# Patient Record
Sex: Female | Born: 1980 | Race: White | Hispanic: No | Marital: Single | Smoking: Former smoker
Health system: Southern US, Community
[De-identification: ages and names within clinical notes are randomized; demographics above are authoritative.]

## PROBLEM LIST (undated history)

## (undated) DIAGNOSIS — F319 Bipolar disorder, unspecified: Secondary | ICD-10-CM

## (undated) DIAGNOSIS — F4310 Post-traumatic stress disorder, unspecified: Secondary | ICD-10-CM

## (undated) HISTORY — DX: Post-traumatic stress disorder, unspecified: F43.10

## (undated) HISTORY — PX: HAND SURGERY: SHX662

## (undated) HISTORY — DX: Bipolar disorder, unspecified: F31.9

---
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
## 2014-02-20 NOTE — L&D Delivery Note (Cosign Needed)
Delivery Note Kristen DouglasMaria A Howard is a 34 y.o. female G3P1011 with an uncomplicated IUP at 9788w2d who presented on 12/30/2014 for SOL. She was assessed to be in active labor on arrival and was subsequently admitted to the Labor & Delivery unit. An epidural was placed at the patient's request. She progressed without issue or any augmentation. At 11:39 PM she delivered a healthy baby boy via uncomplicated spontaneous vaginal delivery (LOA). Cord clamping was delayed and third stage of labor proceeded as expected with spontaneous delivery of the placenta and subsequent hemostasis.  APGAR (1 MIN): 8   APGAR (5 MINS): 9   Weight: 7 lb 15 oz (3600 g) Placenta status: intact Cord: 3 vessel Complications: none  Anesthesia: Epidural  Episiotomy: None Lacerations: 1st degree L labial; 1st degree perineal Suture Repair: 3.0 vicryl Est. Blood Loss (mL): 350 mL   Mom to postpartum.  Baby to Couplet care / Skin to Skin.  Gerri Sporeaitlin Sullivan, MD 12/31/2014 12:39 AM   Patient is a G3P1011 at 5052w1d who was admitted w/ SOL, significant hx of bipolar, but essentially w/ uncomplicated prenatal course.  She progressed without augmentation.  I was gloved and present for delivery in its entirety.  Second stage of labor progressed to SVD.  Complications: none  Lacerations: see above  EBL: 350cc  [name], [name], CNM 11:44 PM  01/01/2015

## 2014-07-08 LAB — OB RESULTS CONSOLE HGB/HCT, BLOOD
HCT: 36 %
HEMOGLOBIN: 12 g/dL

## 2014-07-08 LAB — OB RESULTS CONSOLE ABO/RH: RH TYPE: POSITIVE

## 2014-07-08 LAB — OB RESULTS CONSOLE ANTIBODY SCREEN: Antibody Screen: NEGATIVE

## 2014-07-08 LAB — OB RESULTS CONSOLE GC/CHLAMYDIA
Chlamydia: NEGATIVE
Gonorrhea: NEGATIVE

## 2014-07-08 LAB — OB RESULTS CONSOLE HEPATITIS B SURFACE ANTIGEN: Hepatitis B Surface Ag: NEGATIVE

## 2014-07-08 LAB — OB RESULTS CONSOLE VARICELLA ZOSTER ANTIBODY, IGG: Varicella: IMMUNE

## 2014-07-08 LAB — OB RESULTS CONSOLE RPR: RPR: NONREACTIVE

## 2014-07-08 LAB — OB RESULTS CONSOLE RUBELLA ANTIBODY, IGM: RUBELLA: IMMUNE

## 2014-07-08 LAB — OB RESULTS CONSOLE PLATELET COUNT: PLATELETS: 241 10*3/uL

## 2014-07-08 LAB — OB RESULTS CONSOLE HIV ANTIBODY (ROUTINE TESTING): HIV: NONREACTIVE

## 2014-07-11 LAB — CYTOLOGY - PAP: PAP SMEAR: NEGATIVE

## 2014-08-05 ENCOUNTER — Encounter: Payer: Self-pay | Admitting: *Deleted

## 2014-08-20 ENCOUNTER — Ambulatory Visit (INDEPENDENT_AMBULATORY_CARE_PROVIDER_SITE_OTHER): Payer: Medicaid Other | Admitting: Obstetrics & Gynecology

## 2014-08-20 ENCOUNTER — Encounter: Payer: Self-pay | Admitting: Obstetrics & Gynecology

## 2014-08-20 VITALS — BP 124/69 | HR 96 | Temp 98.6°F | Ht 68.0 in | Wt 199.1 lb

## 2014-08-20 DIAGNOSIS — O0990 Supervision of high risk pregnancy, unspecified, unspecified trimester: Secondary | ICD-10-CM | POA: Insufficient documentation

## 2014-08-20 DIAGNOSIS — F319 Bipolar disorder, unspecified: Secondary | ICD-10-CM

## 2014-08-20 DIAGNOSIS — Z3482 Encounter for supervision of other normal pregnancy, second trimester: Secondary | ICD-10-CM

## 2014-08-20 DIAGNOSIS — O99342 Other mental disorders complicating pregnancy, second trimester: Secondary | ICD-10-CM

## 2014-08-20 DIAGNOSIS — O99343 Other mental disorders complicating pregnancy, third trimester: Secondary | ICD-10-CM | POA: Insufficient documentation

## 2014-08-20 MED ORDER — PANTOPRAZOLE SODIUM 40 MG PO TBEC: 40.0000 mg | DELAYED_RELEASE_TABLET | Freq: Every day | ORAL | Status: AC

## 2014-08-20 MED ORDER — PRENATAL VITAMINS 0.8 MG PO TABS: 1.0000 | ORAL_TABLET | Freq: Every day | ORAL | Status: AC

## 2014-08-20 NOTE — Progress Notes (Signed)
   [facility]    Kristen Howard is a [id number] 8779w2d being seen today for her first obstetrical visit.  Her obstetrical history is significant for bipolar d/o, social problem. Patient does intend to breast feed. Pregnancy history fully reviewed.  Patient reports heartburn.  Filed Vitals:   08/20/14 0831 08/20/14 0832  BP: 124/69   Pulse: 96   Temp: 98.6 F (37 C)   Height:  5\' 8"  (1.727 m)  Weight: 199 lb 1.6 oz (90.311 kg)     HISTORY: OB History  Gravida Para Term Preterm AB SAB TAB Ectopic Multiple Living  3 1 1  0 1  1   1     # Outcome Date GA Lbr Len/2nd Weight Sex Delivery Anes PTL Lv  3 Current           2 Term 04/16/03 724w0d  5 lb 13 oz (2.637 kg) M Vag-Spont     1 TAB              Past Medical History  Diagnosis Date  . Manic depression   . PTSD (post-traumatic stress disorder)    Past Surgical History  Procedure Laterality Date  . Breast surgery  1998     Lump removed about age 34   Family History  Problem Relation Age of Onset  . Depression Mother   . Anxiety disorder Mother   . Alcoholism Mother   . Diabetes Mother   . Hypertension Mother   . Drug abuse Mother   . Lung cancer Father      Exam    Uterus:  Fundal Height: 22 cm  Pelvic Exam:                                    Skin: normal coloration and turgor, no rashes    Neurologic: oriented, normal   Extremities: normal strength, tone, and muscle mass           Neck supple   Cardiovascular: regular rate and rhythm   Respiratory:  appears well, vitals normal, no respiratory distress, acyanotic, normal RR   Abdomen: soft, non-tender; bowel sounds normal; no masses,  no organomegaly   Urinary:        Assessment:    Pregnancy: G3P1011 Patient Active Problem List   Diagnosis Date Noted  . Supervision of normal pregnancy, antepartum 08/20/2014  . Bipolar disease during pregnancy in second trimester 08/20/2014    Reflux Sx    Plan:    Protonix  for reflux Initial labs reviewed from record. Prenatal vitamins. Problem list reviewed and updated. Genetic Screening discussed Quad Screen: ordered.  Ultrasound discussed; fetal survey: ordered.  Follow up in 4 weeks. 50% of 30 min visit spent on counseling and coordination of care.  SW visit today   Antwione Picotte 08/20/2014

## 2014-08-20 NOTE — Progress Notes (Signed)
Here for initial Prenatal visit. Transferring [facility]. Given prenatal education booklets.

## 2014-08-20 NOTE — Progress Notes (Signed)
Anatomy ultrasound scheduled for 08/25/2014 @ 11:00AM List of dental resources given to the patient. Explained importance of good oral hygiene as it affects pregnancy. Encouraged to call  ASAP to schedule appointment as it sometimes takes several weeks for appointment.

## 2014-08-20 NOTE — Patient Instructions (Signed)
Second Trimester of Pregnancy The second trimester is from week 13 through week 28, months 4 through 6. The second trimester is often a time when you feel your best. Your body has also adjusted to being pregnant, and you begin to feel better physically. Usually, morning sickness has lessened or quit completely, you may have more energy, and you may have an increase in appetite. The second trimester is also a time when the fetus is growing rapidly. At the end of the sixth month, the fetus is about 9 inches long and weighs about 1 pounds. You will likely begin to feel the baby move (quickening) between 18 and 20 weeks of the pregnancy. BODY CHANGES Your body goes through many changes during pregnancy. The changes vary from woman to woman.   Your weight will continue to increase. You will notice your lower abdomen bulging out.  You may begin to get stretch marks on your hips, abdomen, and breasts.  You may develop headaches that can be relieved by medicines approved by your health care provider.  You may urinate more often because the fetus is pressing on your bladder.  You may develop or continue to have heartburn as a result of your pregnancy.  You may develop constipation because certain hormones are causing the muscles that push waste through your intestines to slow down.  You may develop hemorrhoids or swollen, bulging veins (varicose veins).  You may have back pain because of the weight gain and pregnancy hormones relaxing your joints between the bones in your pelvis and as a result of a shift in weight and the muscles that support your balance.  Your breasts will continue to grow and be tender.  Your gums may bleed and may be sensitive to brushing and flossing.  Dark spots or blotches (chloasma, mask of pregnancy) may develop on your face. This will likely fade after the baby is born.  A dark line from your belly button to the pubic area (linea nigra) may appear. This will likely fade  after the baby is born.  You may have changes in your hair. These can include thickening of your hair, rapid growth, and changes in texture. Some women also have hair loss during or after pregnancy, or hair that feels dry or thin. Your hair will most likely return to normal after your baby is born. WHAT TO EXPECT AT YOUR PRENATAL VISITS During a routine prenatal visit:  You will be weighed to make sure you and the fetus are growing normally.  Your blood pressure will be taken.  Your abdomen will be measured to track your baby's growth.  The fetal heartbeat will be listened to.  Any test results from the previous visit will be discussed. Your health care provider may ask you:  How you are feeling.  If you are feeling the baby move.  If you have had any abnormal symptoms, such as leaking fluid, bleeding, severe headaches, or abdominal cramping.  If you have any questions. Other tests that may be performed during your second trimester include:  Blood tests that check for:  Low iron levels (anemia).  Gestational diabetes (between 24 and 28 weeks).  Rh antibodies.  Urine tests to check for infections, diabetes, or protein in the urine.  An ultrasound to confirm the proper growth and development of the baby.  An amniocentesis to check for possible genetic problems.  Fetal screens for spina bifida and Down syndrome. HOME CARE INSTRUCTIONS   Avoid all smoking, herbs, alcohol, and unprescribed   drugs. These chemicals affect the formation and growth of the baby.  Follow your health care provider's instructions regarding medicine use. There are medicines that are either safe or unsafe to take during pregnancy.  Exercise only as directed by your health care provider. Experiencing uterine cramps is a good sign to stop exercising.  Continue to eat regular, healthy meals.  Wear a good support bra for breast tenderness.  Do not use hot tubs, steam rooms, or saunas.  Wear your  seat belt at all times when driving.  Avoid raw meat, uncooked cheese, cat litter boxes, and soil used by cats. These carry germs that can cause birth defects in the baby.  Take your prenatal vitamins.  Try taking a stool softener (if your health care provider approves) if you develop constipation. Eat more high-fiber foods, such as fresh vegetables or fruit and whole grains. Drink plenty of fluids to keep your urine clear or pale yellow.  Take warm sitz baths to soothe any pain or discomfort caused by hemorrhoids. Use hemorrhoid cream if your health care provider approves.  If you develop varicose veins, wear support hose. Elevate your feet for 15 minutes, 3-4 times a day. Limit salt in your diet.  Avoid heavy lifting, wear low heel shoes, and practice good posture.  Rest with your legs elevated if you have leg cramps or low back pain.  Visit your dentist if you have not gone yet during your pregnancy. Use a soft toothbrush to brush your teeth and be gentle when you floss.  A sexual relationship may be continued unless your health care provider directs you otherwise.  Continue to go to all your prenatal visits as directed by your health care provider. SEEK MEDICAL CARE IF:   You have dizziness.  You have mild pelvic cramps, pelvic pressure, or nagging pain in the abdominal area.  You have persistent nausea, vomiting, or diarrhea.  You have a bad smelling vaginal discharge.  You have pain with urination. SEEK IMMEDIATE MEDICAL CARE IF:   You have a fever.  You are leaking fluid from your vagina.  You have spotting or bleeding from your vagina.  You have severe abdominal cramping or pain.  You have rapid weight gain or loss.  You have shortness of breath with chest pain.  You notice sudden or extreme swelling of your face, hands, ankles, feet, or legs.  You have not felt your baby move in over an hour.  You have severe headaches that do not go away with  medicine.  You have vision changes. Document Released: 01/31/2001 Document Revised: 02/11/2013 Document Reviewed: 04/09/2012 ExitCare Patient Information 2015 [employer]. This information is not intended to replace advice given to you by your health care provider. Make sure you discuss any questions you have with your health care provider.  

## 2014-08-21 ENCOUNTER — Telehealth: Payer: Self-pay | Admitting: *Deleted

## 2014-08-21 DIAGNOSIS — F319 Bipolar disorder, unspecified: Secondary | ICD-10-CM

## 2014-08-21 DIAGNOSIS — Z3482 Encounter for supervision of other normal pregnancy, second trimester: Secondary | ICD-10-CM

## 2014-08-21 DIAGNOSIS — O99342 Other mental disorders complicating pregnancy, second trimester: Secondary | ICD-10-CM

## 2014-08-21 NOTE — Telephone Encounter (Signed)
[facility]  Confirmed prescriptions for Protonix and PNV.  They will fill and notify patient.

## 2014-08-21 NOTE — Telephone Encounter (Signed)
[facility] on 08/20/14 at 1558.  States patient is in a group home and they received a call from the home stating the patient should have a prescription from us.  They haven't received any prescriptions from us and ask that we call with prescription so they can fill for patient.

## 2014-08-25 ENCOUNTER — Other Ambulatory Visit: Payer: Self-pay | Admitting: Obstetrics & Gynecology

## 2014-08-25 ENCOUNTER — Ambulatory Visit (HOSPITAL_COMMUNITY)
Admission: RE | Admit: 2014-08-25 | Discharge: 2014-08-25 | Disposition: A | Discharge status: Home / Self Care | Payer: Medicaid Other | Source: Ambulatory Visit | Attending: Obstetrics & Gynecology | Admitting: Obstetrics & Gynecology

## 2014-08-25 DIAGNOSIS — Z36 Encounter for antenatal screening of mother: Secondary | ICD-10-CM | POA: Diagnosis not present

## 2014-08-25 DIAGNOSIS — Z3A22 22 weeks gestation of pregnancy: Secondary | ICD-10-CM | POA: Diagnosis not present

## 2014-08-25 DIAGNOSIS — O99342 Other mental disorders complicating pregnancy, second trimester: Secondary | ICD-10-CM | POA: Insufficient documentation

## 2014-08-25 DIAGNOSIS — F319 Bipolar disorder, unspecified: Secondary | ICD-10-CM

## 2014-08-25 DIAGNOSIS — Z3482 Encounter for supervision of other normal pregnancy, second trimester: Secondary | ICD-10-CM

## 2014-08-25 DIAGNOSIS — Z3689 Encounter for other specified antenatal screening: Secondary | ICD-10-CM | POA: Insufficient documentation

## 2014-08-27 LAB — AFP, QUAD SCREEN
AFP: 54.5 ng/mL
Age Alone: 1:452 {titer}
Curr Gest Age: 21.1 wks.days
Down Syndrome Scr Risk Est: 1:9690 {titer}
HCG, Total: 5.01 IU/mL
INH: 238.5 pg/mL
Interpretation-AFP: NEGATIVE
MOM FOR AFP: 0.96
MoM for INH: 1.42
MoM for hCG: 0.28
Open Spina bifida: NEGATIVE
Osb Risk: 1:15200 {titer}
TRI 18 SCR RISK EST: NEGATIVE
UE3 VALUE: 2.31 ng/mL
uE3 Mom: 1.14

## 2014-09-16 ENCOUNTER — Ambulatory Visit (INDEPENDENT_AMBULATORY_CARE_PROVIDER_SITE_OTHER): Payer: Medicaid Other | Admitting: Advanced Practice Midwife

## 2014-09-16 VITALS — BP 140/75 | HR 111 | Temp 98.1°F | Wt 202.8 lb

## 2014-09-16 DIAGNOSIS — O132 Gestational [pregnancy-induced] hypertension without significant proteinuria, second trimester: Secondary | ICD-10-CM

## 2014-09-16 DIAGNOSIS — O162 Unspecified maternal hypertension, second trimester: Secondary | ICD-10-CM

## 2014-09-16 LAB — POCT URINALYSIS DIP (DEVICE)
Bilirubin Urine: NEGATIVE
Glucose, UA: NEGATIVE mg/dL
Hgb urine dipstick: NEGATIVE
Ketones, ur: NEGATIVE mg/dL
Nitrite: NEGATIVE
Protein, ur: NEGATIVE mg/dL
SPECIFIC GRAVITY, URINE: 1.025 (ref 1.005–1.030)
UROBILINOGEN UA: 0.2 mg/dL (ref 0.0–1.0)
pH: 6.5 (ref 5.0–8.0)

## 2014-09-16 NOTE — Progress Notes (Signed)
Subjective:  Kristen Howard is a 34 y.o. G3P1011 at [redacted]w[redacted]d being seen today for ongoing prenatal care.  Patient reports swelling of feet/hands.  She denies h/a, epigastric pain, or visual disturbances.  Does report soreness of ribs on left side r/t fetal position.  Contractions: Not present.  Vag. Bleeding: None. Movement: Present. Denies leaking of fluid.   The following portions of the patient's history were reviewed and updated as appropriate: allergies, current medications, past family history, past medical history, past social history, past surgical history and problem list.   Objective:   Filed Vitals:   09/16/14 1008  BP: 140/75  Pulse: 111  Temp: 98.1 F (36.7 C)  Weight: 202 lb 12.8 oz (91.989 kg)    Fetal Status: Fetal Heart Rate (bpm): 143 Fundal Height: 26 cm Movement: Present     General:  Alert, oriented and cooperative. Patient is in no acute distress.  Skin: Skin is warm and dry. No rash noted.   Cardiovascular: Normal heart rate noted  Respiratory: Normal respiratory effort, no problems with respiration noted  Abdomen: Soft, gravid, appropriate for gestational age. Pain/Pressure: Present     Vaginal: Vag. Bleeding: None.       Cervix: Not evaluated        Extremities: Normal range of motion.  Edema: Mild pitting, slight indentation  Mental Status: Normal mood and affect. Normal behavior. Normal judgment and thought content.   Urinalysis:      Assessment and Plan:  Pregnancy: G3P1011 at [redacted]w[redacted]d  There are no diagnoses linked to this encounter. Preterm labor symptoms and general obstetric precautions including but not limited to vaginal bleeding, contractions, leaking of fluid and fetal movement were reviewed in detail with the patient. Preeclampsia precautions given BP retake still elevated.  CBC, CMP, P/C ratio collected today.   Please refer to After Visit Summary for other counseling recommendations.  Return in about 4 weeks (around 10/14/2014).   Hurshel Party, CNM

## 2014-09-17 LAB — CBC
HCT: 32.7 % — ABNORMAL LOW (ref 36.0–46.0)
HEMOGLOBIN: 10.9 g/dL — AB (ref 12.0–15.0)
MCH: 31.6 pg (ref 26.0–34.0)
MCHC: 33.3 g/dL (ref 30.0–36.0)
MCV: 94.8 fL (ref 78.0–100.0)
MPV: 10.5 fL (ref 8.6–12.4)
PLATELETS: 268 10*3/uL (ref 150–400)
RBC: 3.45 MIL/uL — AB (ref 3.87–5.11)
RDW: 13.8 % (ref 11.5–15.5)
WBC: 11.4 10*3/uL — ABNORMAL HIGH (ref 4.0–10.5)

## 2014-09-17 LAB — COMPREHENSIVE METABOLIC PANEL
ALBUMIN: 3.2 g/dL — AB (ref 3.6–5.1)
ALK PHOS: 73 U/L (ref 33–115)
ALT: 19 U/L (ref 6–29)
AST: 16 U/L (ref 10–30)
BUN: 8 mg/dL (ref 7–25)
CO2: 22 meq/L (ref 20–31)
Calcium: 8.7 mg/dL (ref 8.6–10.2)
Chloride: 103 mEq/L (ref 98–110)
Creat: 0.48 mg/dL — ABNORMAL LOW (ref 0.50–1.10)
Glucose, Bld: 118 mg/dL — ABNORMAL HIGH (ref 65–99)
POTASSIUM: 4 meq/L (ref 3.5–5.3)
Sodium: 137 mEq/L (ref 135–146)
Total Bilirubin: 0.2 mg/dL (ref 0.2–1.2)
Total Protein: 6.7 g/dL (ref 6.1–8.1)

## 2014-09-17 LAB — PROTEIN / CREATININE RATIO, URINE
CREATININE, URINE: 161.1 mg/dL
PROTEIN CREATININE RATIO: 0.09 (ref <0.15)
Total Protein, Urine: 15 mg/dL (ref 5–24)

## 2014-10-14 ENCOUNTER — Ambulatory Visit (INDEPENDENT_AMBULATORY_CARE_PROVIDER_SITE_OTHER): Payer: Medicaid Other | Admitting: Physician Assistant

## 2014-10-14 VITALS — BP 123/82 | HR 106 | Temp 98.4°F | Wt 210.2 lb

## 2014-10-14 DIAGNOSIS — O99343 Other mental disorders complicating pregnancy, third trimester: Secondary | ICD-10-CM

## 2014-10-14 DIAGNOSIS — F319 Bipolar disorder, unspecified: Secondary | ICD-10-CM | POA: Diagnosis not present

## 2014-10-14 DIAGNOSIS — Z3403 Encounter for supervision of normal first pregnancy, third trimester: Secondary | ICD-10-CM

## 2014-10-14 DIAGNOSIS — O0973 Supervision of high risk pregnancy due to social problems, third trimester: Secondary | ICD-10-CM

## 2014-10-14 DIAGNOSIS — M79672 Pain in left foot: Secondary | ICD-10-CM

## 2014-10-14 DIAGNOSIS — Z3483 Encounter for supervision of other normal pregnancy, third trimester: Secondary | ICD-10-CM

## 2014-10-14 DIAGNOSIS — Z23 Encounter for immunization: Secondary | ICD-10-CM | POA: Diagnosis not present

## 2014-10-14 LAB — POCT URINALYSIS DIP (DEVICE)
Bilirubin Urine: NEGATIVE
GLUCOSE, UA: NEGATIVE mg/dL
Hgb urine dipstick: NEGATIVE
KETONES UR: NEGATIVE mg/dL
LEUKOCYTES UA: NEGATIVE
Nitrite: NEGATIVE
PROTEIN: NEGATIVE mg/dL
Specific Gravity, Urine: 1.025 (ref 1.005–1.030)
Urobilinogen, UA: 0.2 mg/dL (ref 0.0–1.0)
pH: 6.5 (ref 5.0–8.0)

## 2014-10-14 LAB — CBC
HCT: 31.2 % — ABNORMAL LOW (ref 36.0–46.0)
HEMOGLOBIN: 10.9 g/dL — AB (ref 12.0–15.0)
MCH: 32.1 pg (ref 26.0–34.0)
MCHC: 34.9 g/dL (ref 30.0–36.0)
MCV: 91.8 fL (ref 78.0–100.0)
MPV: 11.2 fL (ref 8.6–12.4)
Platelets: 262 10*3/uL (ref 150–400)
RBC: 3.4 MIL/uL — ABNORMAL LOW (ref 3.87–5.11)
RDW: 13.7 % (ref 11.5–15.5)
WBC: 10.7 10*3/uL — ABNORMAL HIGH (ref 4.0–10.5)

## 2014-10-14 MED ORDER — TETANUS-DIPHTH-ACELL PERTUSSIS 5-2.5-18.5 LF-MCG/0.5 IM SUSP
0.5000 mL | Freq: Once | INTRAMUSCULAR | Status: AC
  Administered 2014-10-14: 0.5 mL via INTRAMUSCULAR

## 2014-10-14 NOTE — Progress Notes (Signed)
Subjective:  Kristen Howard is a 34 y.o. G3P1011 at [redacted]w[redacted]d being seen today for ongoing prenatal care.  Patient reports foot pain ongoing x several weeks. Worst is on top of foot near toes and behind lateral maleolus of left foot.    .  Contractions: Not present.  Vag. Bleeding: None. Movement: Present. Denies leaking of fluid.   The following portions of the patient's history were reviewed and updated as appropriate: allergies, current medications, past family history, past medical history, past social history, past surgical history and problem list.   Objective:   Filed Vitals:   10/14/14 1020  BP: 123/82  Pulse: 106  Temp: 98.4 F (36.9 C)  Weight: 210 lb 3.2 oz (95.346 kg)    Fetal Status: Fetal Heart Rate (bpm): 145   Movement: Present     General:  Alert, oriented and cooperative. Patient is in no acute distress.  Skin: Skin is warm and dry. No rash noted.   Cardiovascular: Normal heart rate noted  Respiratory: Normal respiratory effort, no problems with respiration noted  Abdomen: Soft, gravid, appropriate for gestational age. Pain/Pressure: Present     Pelvic: Vag. Bleeding: None     Cervical exam deferred        Extremities: Normal range of motion.  Edema: Mild pitting, slight indentation  Mental Status: Normal mood and affect. Normal behavior. Normal judgment and thought content.   Urinalysis:      Assessment and Plan:  Pregnancy: G3P1011 at [redacted]w[redacted]d  1. Supervision of normal first pregnancy in third trimester Continue daily PNV - CBC - RPR - HIV antibody (with reflex) - Glucose Tolerance, 1 HR (50g) w/o Fasting - Tdap (BOOSTRIX) injection 0.5 mL; Inject 0.5 mLs into the muscle once. - Flu Vaccine QUAD 36+ mos IM; Standing - Flu Vaccine QUAD 36+ mos IM  2. Supervision of high risk pregnancy due to social problems in third trimester Lives at [facility].  Has no support system.  No way of making income at present.  3. Bipolar disease during pregnancy in third  trimester Working with [employer] to treat.  On multiple medications.  Symptoms well controlled at present but encouraged to work very closely with mental health provider to minimize medications and maximize symptom control.   4.  Foot pain: Pt to stop wearing flip flops/open backed shoes.  Use ice on foot 20 minutes/3x/day.  Stretches taught.  Preterm labor symptoms and general obstetric precautions including but not limited to vaginal bleeding, contractions, leaking of fluid and fetal movement were reviewed in detail with the patient. Please refer to After Visit Summary for other counseling recommendations.  F/u 2 weeks  Bertram Denver, PA-C

## 2014-10-14 NOTE — Progress Notes (Signed)
Patient reports pain in left foot and severe swelling by the end of the day in her feet Reviewed tip of week with patient

## 2014-10-15 LAB — HIV ANTIBODY (ROUTINE TESTING W REFLEX): HIV 1&2 Ab, 4th Generation: NONREACTIVE

## 2014-10-15 LAB — GLUCOSE TOLERANCE, 1 HOUR (50G) W/O FASTING: Glucose, 1 Hour GTT: 111 mg/dL (ref 70–140)

## 2014-10-15 LAB — RPR

## 2014-10-15 NOTE — Patient Instructions (Signed)
Third Trimester of Pregnancy The third trimester is from week 29 through week 42, months 7 through 9. The third trimester is a time when the fetus is growing rapidly. At the end of the ninth month, the fetus is about 20 inches in length and weighs 6-10 pounds.  BODY CHANGES Your body goes through many changes during pregnancy. The changes vary from woman to woman.   Your weight will continue to increase. You can expect to gain 25-35 pounds (11-16 kg) by the end of the pregnancy.  You may begin to get stretch marks on your hips, abdomen, and breasts.  You may urinate more often because the fetus is moving lower into your pelvis and pressing on your bladder.  You may develop or continue to have heartburn as a result of your pregnancy.  You may develop constipation because certain hormones are causing the muscles that push waste through your intestines to slow down.  You may develop hemorrhoids or swollen, bulging veins (varicose veins).  You may have pelvic pain because of the weight gain and pregnancy hormones relaxing your joints between the bones in your pelvis. Backaches may result from overexertion of the muscles supporting your posture.  You may have changes in your hair. These can include thickening of your hair, rapid growth, and changes in texture. Some women also have hair loss during or after pregnancy, or hair that feels dry or thin. Your hair will most likely return to normal after your baby is born.  Your breasts will continue to grow and be tender. A yellow discharge may leak from your breasts called colostrum.  Your belly button may stick out.  You may feel short of breath because of your expanding uterus.  You may notice the fetus "dropping," or moving lower in your abdomen.  You may have a bloody mucus discharge. This usually occurs a few days to a week before labor begins.  Your cervix becomes thin and soft (effaced) near your due date. WHAT TO EXPECT AT YOUR PRENATAL  EXAMS  You will have prenatal exams every 2 weeks until week 36. Then, you will have weekly prenatal exams. During a routine prenatal visit:  You will be weighed to make sure you and the fetus are growing normally.  Your blood pressure is taken.  Your abdomen will be measured to track your baby's growth.  The fetal heartbeat will be listened to.  Any test results from the previous visit will be discussed.  You may have a cervical check near your due date to see if you have effaced. At around 36 weeks, your caregiver will check your cervix. At the same time, your caregiver will also perform a test on the secretions of the vaginal tissue. This test is to determine if a type of bacteria, Group B streptococcus, is present. Your caregiver will explain this further. Your caregiver may ask you:  What your birth plan is.  How you are feeling.  If you are feeling the baby move.  If you have had any abnormal symptoms, such as leaking fluid, bleeding, severe headaches, or abdominal cramping.  If you have any questions. Other tests or screenings that may be performed during your third trimester include:  Blood tests that check for low iron levels (anemia).  Fetal testing to check the health, activity level, and growth of the fetus. Testing is done if you have certain medical conditions or if there are problems during the pregnancy. FALSE LABOR You may feel small, irregular contractions that   eventually go away. These are called Braxton Hicks contractions, or false labor. Contractions may last for hours, days, or even weeks before true labor sets in. If contractions come at regular intervals, intensify, or become painful, it is best to be seen by your caregiver.  SIGNS OF LABOR   Menstrual-like cramps.  Contractions that are 5 minutes apart or less.  Contractions that start on the top of the uterus and spread down to the lower abdomen and back.  A sense of increased pelvic pressure or back  pain.  A watery or bloody mucus discharge that comes from the vagina. If you have any of these signs before the 37th week of pregnancy, call your caregiver right away. You need to go to the hospital to get checked immediately. HOME CARE INSTRUCTIONS   Avoid all smoking, herbs, alcohol, and unprescribed drugs. These chemicals affect the formation and growth of the baby.  Follow your caregiver's instructions regarding medicine use. There are medicines that are either safe or unsafe to take during pregnancy.  Exercise only as directed by your caregiver. Experiencing uterine cramps is a good sign to stop exercising.  Continue to eat regular, healthy meals.  Wear a good support bra for breast tenderness.  Do not use hot tubs, steam rooms, or saunas.  Wear your seat belt at all times when driving.  Avoid raw meat, uncooked cheese, cat litter boxes, and soil used by cats. These carry germs that can cause birth defects in the baby.  Take your prenatal vitamins.  Try taking a stool softener (if your caregiver approves) if you develop constipation. Eat more high-fiber foods, such as fresh vegetables or fruit and whole grains. Drink plenty of fluids to keep your urine clear or pale yellow.  Take warm sitz baths to soothe any pain or discomfort caused by hemorrhoids. Use hemorrhoid cream if your caregiver approves.  If you develop varicose veins, wear support hose. Elevate your feet for 15 minutes, 3-4 times a day. Limit salt in your diet.  Avoid heavy lifting, wear low heal shoes, and practice good posture.  Rest a lot with your legs elevated if you have leg cramps or low back pain.  Visit your dentist if you have not gone during your pregnancy. Use a soft toothbrush to brush your teeth and be gentle when you floss.  A sexual relationship may be continued unless your caregiver directs you otherwise.  Do not travel far distances unless it is absolutely necessary and only with the approval  of your caregiver.  Take prenatal classes to understand, practice, and ask questions about the labor and delivery.  Make a trial run to the hospital.  Pack your hospital bag.  Prepare the baby's nursery.  Continue to go to all your prenatal visits as directed by your caregiver. SEEK MEDICAL CARE IF:  You are unsure if you are in labor or if your water has broken.  You have dizziness.  You have mild pelvic cramps, pelvic pressure, or nagging pain in your abdominal area.  You have persistent nausea, vomiting, or diarrhea.  You have a bad smelling vaginal discharge.  You have pain with urination. SEEK IMMEDIATE MEDICAL CARE IF:   You have a fever.  You are leaking fluid from your vagina.  You have spotting or bleeding from your vagina.  You have severe abdominal cramping or pain.  You have rapid weight loss or gain.  You have shortness of breath with chest pain.  You notice sudden or extreme swelling   of your face, hands, ankles, feet, or legs.  You have not felt your baby move in over an hour.  You have severe headaches that do not go away with medicine.  You have vision changes. Document Released: 01/31/2001 Document Revised: 02/11/2013 Document Reviewed: 04/09/2012 Exit[employer] Patient Information 2015 [employer]. This information is not intended to replace advice given to you by your health care provider. Make sure you discuss any questions you have with your health care provider.  

## 2014-10-28 ENCOUNTER — Encounter: Payer: Medicaid Other | Admitting: Obstetrics and Gynecology

## 2014-11-11 ENCOUNTER — Ambulatory Visit (INDEPENDENT_AMBULATORY_CARE_PROVIDER_SITE_OTHER): Payer: Medicaid Other | Admitting: Obstetrics and Gynecology

## 2014-11-11 ENCOUNTER — Encounter: Payer: Self-pay | Admitting: Obstetrics and Gynecology

## 2014-11-11 VITALS — BP 121/79 | HR 97 | Temp 98.5°F | Wt 213.0 lb

## 2014-11-11 DIAGNOSIS — Z9189 Other specified personal risk factors, not elsewhere classified: Secondary | ICD-10-CM

## 2014-11-11 DIAGNOSIS — O99342 Other mental disorders complicating pregnancy, second trimester: Secondary | ICD-10-CM

## 2014-11-11 DIAGNOSIS — Z3483 Encounter for supervision of other normal pregnancy, third trimester: Secondary | ICD-10-CM

## 2014-11-11 DIAGNOSIS — Z1389 Encounter for screening for other disorder: Secondary | ICD-10-CM

## 2014-11-11 DIAGNOSIS — F319 Bipolar disorder, unspecified: Secondary | ICD-10-CM

## 2014-11-11 NOTE — Progress Notes (Signed)
Subjective:  Kristen Howard is a 34 y.o. G3P1011 at [redacted]w[redacted]d being seen today for ongoing prenatal care.  Patient reports fatigue. Has mild depressive sx (see screen done today) and taking psych meds as directed. Goes to Group counseling q week and has appointment for 1:1 counseling scheduled.  Stays at Room at the [facility] Concerned she does not have help with newborn and is "not as excited as she should be", but will be at Room at the [facility].  States chest nevus has increased in size with pregnancy. Has lower abdominal pressure, worse with walking.  Contractions: Not present.  Vag. Bleeding: None. Movement: Present. Denies leaking of fluid. No H/A, visual disturbance, epigastric pain, dysuria.  The following portions of the patient's history were reviewed and updated as appropriate: allergies, current medications, past family history, past medical history, past social history, past surgical history and problem list.   Objective:   Filed Vitals:   11/11/14 1033 11/11/14 1037  BP: 130/90 121/79  Pulse: 112 97  Temp: 98.5 F (36.9 C)   Weight: 213 lb (96.616 kg)     Fetal Status: Fetal Heart Rate (bpm): 134   Movement: Present    Affect appropriate, somewhat blunted. General:  Alert, oriented and cooperative. Patient is in no acute distress.  Skin: Skin is warm and dry. No rash noted. 2 cm cauliflower-like dark nevus left upper chest  Cardiovascular: Normal heart rate noted  Respiratory: Normal respiratory effort, no problems with respiration noted  Abdomen: Soft, gravid, appropriate for gestational age. Pain/Pressure: Present     Pelvic: Vag. Bleeding: None     Cervical exam performed        Extremities: Normal range of motion.  Edema: Mild pitting, slight indentation  Mental Status: Normal mood and affect. Normal behavior. Normal judgment and thought content.   Urinalysis:      Assessment and Plan:  Pregnancy: G3P1011 at [redacted]w[redacted]d  1. Supervision of normal pregnancy, antepartum, third  trimester Borderline initial BP  2. Bipolar disease during pregnancy in second trimester   Preterm labor symptoms and general obstetric precautions including but not limited to vaginal bleeding, contractions, leaking of fluid and fetal movement were reviewed in detail with the patient. Please refer to After Visit Summary for other counseling recommendations. Continue to screen for depression Return in about 2 weeks (around 11/25/2014). Dermatology referral for nevus of chest  Deirdre Colin Mulders, CNM

## 2014-11-11 NOTE — Progress Notes (Signed)
Patient did not provide a urine sample today- had stated she would collect specimen after seeing provider- but did not.

## 2014-11-11 NOTE — Progress Notes (Signed)
C/o increased pressure and pain. Referred to Dr. Terri Piedra per order. Appointment made for next week.

## 2014-11-11 NOTE — Patient Instructions (Signed)
Third Trimester of Pregnancy The third trimester is from week 29 through week 42, months 7 through 9. The third trimester is a time when the fetus is growing rapidly. At the end of the ninth month, the fetus is about 20 inches in length and weighs 6-10 pounds.  BODY CHANGES Your body goes through many changes during pregnancy. The changes vary from woman to woman.   Your weight will continue to increase. You can expect to gain 25-35 pounds (11-16 kg) by the end of the pregnancy.  You may begin to get stretch marks on your hips, abdomen, and breasts.  You may urinate more often because the fetus is moving lower into your pelvis and pressing on your bladder.  You may develop or continue to have heartburn as a result of your pregnancy.  You may develop constipation because certain hormones are causing the muscles that push waste through your intestines to slow down.  You may develop hemorrhoids or swollen, bulging veins (varicose veins).  You may have pelvic pain because of the weight gain and pregnancy hormones relaxing your joints between the bones in your pelvis. Backaches may result from overexertion of the muscles supporting your posture.  You may have changes in your hair. These can include thickening of your hair, rapid growth, and changes in texture. Some women also have hair loss during or after pregnancy, or hair that feels dry or thin. Your hair will most likely return to normal after your baby is born.  Your breasts will continue to grow and be tender. A yellow discharge may leak from your breasts called colostrum.  Your belly button may stick out.  You may feel short of breath because of your expanding uterus.  You may notice the fetus "dropping," or moving lower in your abdomen.  You may have a bloody mucus discharge. This usually occurs a few days to a week before labor begins.  Your cervix becomes thin and soft (effaced) near your due date. WHAT TO EXPECT AT YOUR PRENATAL  EXAMS  You will have prenatal exams every 2 weeks until week 36. Then, you will have weekly prenatal exams. During a routine prenatal visit:  You will be weighed to make sure you and the fetus are growing normally.  Your blood pressure is taken.  Your abdomen will be measured to track your baby's growth.  The fetal heartbeat will be listened to.  Any test results from the previous visit will be discussed.  You may have a cervical check near your due date to see if you have effaced. At around 36 weeks, your caregiver will check your cervix. At the same time, your caregiver will also perform a test on the secretions of the vaginal tissue. This test is to determine if a type of bacteria, Group B streptococcus, is present. Your caregiver will explain this further. Your caregiver may ask you:  What your birth plan is.  How you are feeling.  If you are feeling the baby move.  If you have had any abnormal symptoms, such as leaking fluid, bleeding, severe headaches, or abdominal cramping.  If you have any questions. Other tests or screenings that may be performed during your third trimester include:  Blood tests that check for low iron levels (anemia).  Fetal testing to check the health, activity level, and growth of the fetus. Testing is done if you have certain medical conditions or if there are problems during the pregnancy. FALSE LABOR You may feel small, irregular contractions that   eventually go away. These are called Braxton Hicks contractions, or false labor. Contractions may last for hours, days, or even weeks before true labor sets in. If contractions come at regular intervals, intensify, or become painful, it is best to be seen by your caregiver.  SIGNS OF LABOR   Menstrual-like cramps.  Contractions that are 5 minutes apart or less.  Contractions that start on the top of the uterus and spread down to the lower abdomen and back.  A sense of increased pelvic pressure or back  pain.  A watery or bloody mucus discharge that comes from the vagina. If you have any of these signs before the 37th week of pregnancy, call your caregiver right away. You need to go to the hospital to get checked immediately. HOME CARE INSTRUCTIONS   Avoid all smoking, herbs, alcohol, and unprescribed drugs. These chemicals affect the formation and growth of the baby.  Follow your caregiver's instructions regarding medicine use. There are medicines that are either safe or unsafe to take during pregnancy.  Exercise only as directed by your caregiver. Experiencing uterine cramps is a good sign to stop exercising.  Continue to eat regular, healthy meals.  Wear a good support bra for breast tenderness.  Do not use hot tubs, steam rooms, or saunas.  Wear your seat belt at all times when driving.  Avoid raw meat, uncooked cheese, cat litter boxes, and soil used by cats. These carry germs that can cause birth defects in the baby.  Take your prenatal vitamins.  Try taking a stool softener (if your caregiver approves) if you develop constipation. Eat more high-fiber foods, such as fresh vegetables or fruit and whole grains. Drink plenty of fluids to keep your urine clear or pale yellow.  Take warm sitz baths to soothe any pain or discomfort caused by hemorrhoids. Use hemorrhoid cream if your caregiver approves.  If you develop varicose veins, wear support hose. Elevate your feet for 15 minutes, 3-4 times a day. Limit salt in your diet.  Avoid heavy lifting, wear low heal shoes, and practice good posture.  Rest a lot with your legs elevated if you have leg cramps or low back pain.  Visit your dentist if you have not gone during your pregnancy. Use a soft toothbrush to brush your teeth and be gentle when you floss.  A sexual relationship may be continued unless your caregiver directs you otherwise.  Do not travel far distances unless it is absolutely necessary and only with the approval  of your caregiver.  Take prenatal classes to understand, practice, and ask questions about the labor and delivery.  Make a trial run to the hospital.  Pack your hospital bag.  Prepare the baby's nursery.  Continue to go to all your prenatal visits as directed by your caregiver. SEEK MEDICAL CARE IF:  You are unsure if you are in labor or if your water has broken.  You have dizziness.  You have mild pelvic cramps, pelvic pressure, or nagging pain in your abdominal area.  You have persistent nausea, vomiting, or diarrhea.  You have a bad smelling vaginal discharge.  You have pain with urination. SEEK IMMEDIATE MEDICAL CARE IF:   You have a fever.  You are leaking fluid from your vagina.  You have spotting or bleeding from your vagina.  You have severe abdominal cramping or pain.  You have rapid weight loss or gain.  You have shortness of breath with chest pain.  You notice sudden or extreme swelling   of your face, hands, ankles, feet, or legs.  You have not felt your baby move in over an hour.  You have severe headaches that do not go away with medicine.  You have vision changes. Document Released: 01/31/2001 Document Revised: 02/11/2013 Document Reviewed: 04/09/2012 Exit[employer] Patient Information 2015 [employer]. This information is not intended to replace advice given to you by your health care provider. Make sure you discuss any questions you have with your health care provider.  

## 2014-11-25 ENCOUNTER — Encounter (HOSPITAL_COMMUNITY): Payer: Self-pay

## 2014-11-25 ENCOUNTER — Ambulatory Visit (INDEPENDENT_AMBULATORY_CARE_PROVIDER_SITE_OTHER): Payer: Medicaid Other | Admitting: Obstetrics and Gynecology

## 2014-11-25 ENCOUNTER — Inpatient Hospital Stay (HOSPITAL_COMMUNITY)
Admission: AD | Admit: 2014-11-25 | Discharge: 2014-11-25 | Disposition: A | Discharge status: Home / Self Care | Payer: Medicaid Other | Source: Ambulatory Visit | Attending: Obstetrics and Gynecology | Admitting: Obstetrics and Gynecology

## 2014-11-25 VITALS — BP 120/78 | HR 84 | Wt 218.3 lb

## 2014-11-25 DIAGNOSIS — O26893 Other specified pregnancy related conditions, third trimester: Secondary | ICD-10-CM | POA: Diagnosis not present

## 2014-11-25 DIAGNOSIS — N949 Unspecified condition associated with female genital organs and menstrual cycle: Secondary | ICD-10-CM | POA: Diagnosis not present

## 2014-11-25 DIAGNOSIS — Z3A35 35 weeks gestation of pregnancy: Secondary | ICD-10-CM | POA: Diagnosis not present

## 2014-11-25 DIAGNOSIS — Z3480 Encounter for supervision of other normal pregnancy, unspecified trimester: Secondary | ICD-10-CM

## 2014-11-25 DIAGNOSIS — R102 Pelvic and perineal pain: Secondary | ICD-10-CM | POA: Diagnosis present

## 2014-11-25 DIAGNOSIS — Z87891 Personal history of nicotine dependence: Secondary | ICD-10-CM | POA: Diagnosis not present

## 2014-11-25 DIAGNOSIS — F319 Bipolar disorder, unspecified: Secondary | ICD-10-CM | POA: Diagnosis not present

## 2014-11-25 DIAGNOSIS — O99343 Other mental disorders complicating pregnancy, third trimester: Secondary | ICD-10-CM | POA: Insufficient documentation

## 2014-11-25 LAB — POCT URINALYSIS DIP (DEVICE)
BILIRUBIN URINE: NEGATIVE
Glucose, UA: NEGATIVE mg/dL
Ketones, ur: NEGATIVE mg/dL
Leukocytes, UA: NEGATIVE
NITRITE: NEGATIVE
PH: 7 (ref 5.0–8.0)
PROTEIN: NEGATIVE mg/dL
Specific Gravity, Urine: 1.02 (ref 1.005–1.030)
UROBILINOGEN UA: 0.2 mg/dL (ref 0.0–1.0)

## 2014-11-25 LAB — URINALYSIS, ROUTINE W REFLEX MICROSCOPIC
BILIRUBIN URINE: NEGATIVE
GLUCOSE, UA: NEGATIVE mg/dL
HGB URINE DIPSTICK: NEGATIVE
KETONES UR: NEGATIVE mg/dL
Leukocytes, UA: NEGATIVE
Nitrite: NEGATIVE
PH: 6.5 (ref 5.0–8.0)
PROTEIN: NEGATIVE mg/dL
Specific Gravity, Urine: >1.03 — ABNORMAL HIGH (ref 1.005–1.030)
Urobilinogen, UA: 0.2 mg/dL (ref 0.0–1.0)

## 2014-11-25 MED ORDER — ZOLPIDEM TARTRATE 5 MG PO TABS: 5.0000 mg | ORAL_TABLET | Freq: Every evening | ORAL | Status: DC | PRN

## 2014-11-25 MED ORDER — ACETAMINOPHEN 500 MG PO TABS: 1000.0000 mg | ORAL_TABLET | Freq: Three times a day (TID) | ORAL | Status: AC | PRN

## 2014-11-25 NOTE — Progress Notes (Signed)
Subjective:  Kristen Howard is a 34 y.o. G3P1011 at [redacted]w[redacted]d being seen today for ongoing prenatal care.  Patient reports intermittent ctxns and pubic bone pain.   Contractions: Irregular.  Vag. Bleeding: None. Movement: Present. Denies leaking of fluid.   The following portions of the patient's history were reviewed and updated as appropriate: allergies, current medications, past family history, past medical history, past social history, past surgical history and problem list.   Objective:   Filed Vitals:   11/25/14 1004  BP: 120/78  Pulse: 84  Weight: 218 lb 4.8 oz (99.02 kg)    Fetal Status: Fetal Heart Rate (bpm): 144 Fundal Height: 35 cm Movement: Present     General:  Alert, oriented and cooperative. Patient is in no acute distress.  Skin: Skin is warm and dry. No rash noted.   Cardiovascular: Normal heart rate noted  Respiratory: Normal respiratory effort, no problems with respiration noted  Abdomen: Soft, gravid, appropriate for gestational age. Pain/Pressure: Present     Pelvic: Vag. Bleeding: None Vag D/C Character: White   Cervical exam deferred        Extremities: Normal range of motion.  Edema: Trace  Mental Status: Normal mood and affect. Normal behavior. Normal judgment and thought content.   Urinalysis:      Assessment and Plan:  Pregnancy: G3P1011 at [redacted]w[redacted]d  1. Supervision of normal pregnancy, antepartum, unspecified trimester - tylenol prescription provided for her group home - gbs/gc/chlamydia next visit  Preterm labor symptoms and general obstetric precautions including but not limited to vaginal bleeding, contractions, leaking of fluid and fetal movement were reviewed in detail with the patient. Please refer to After Visit Summary for other counseling recommendations.  Return in about 1 week (around 12/02/2014).   Kathrynn Running, MD

## 2014-11-25 NOTE — MAU Note (Signed)
Pt presents to MAU with complaints of pelvic pressure. Denies any vaginal bleeding or discharge

## 2014-11-25 NOTE — Progress Notes (Signed)
Lots of pelvic pain and pressure.

## 2014-11-25 NOTE — MAU Provider Note (Signed)
History     CSN: [id number]  Arrival date and time: 11/25/14 1451   None     Chief Complaint  Patient presents with  . Vaginal Pain   HPI   Ms.Kristen Howard is a 34 y.o. female G3P1011 at [redacted]w[redacted]d presenting with vaginal pressure.  The pressure has gotten worse over the last 24 hours.  She was not going to come in but the pain has been keeping her up at night and she feels exhausted.  Patient on multiple medication prescribed by psych Dr. For Manic depression, hyper vigilance and PTSD.   She has occasional braxton hicks contractions especially at night. + fetal movements Denies vaginal bleeding or leaking of fluid.     OB History    Gravida Para Term Preterm AB TAB SAB Ectopic Multiple Living   0 Past Medical History  Diagnosis Date  . Manic depression (HCC)   . PTSD (post-traumatic stress disorder)     Past Surgical History  Procedure Laterality Date  . Breast surgery  1998     Lump removed about age 73    Family History  Problem Relation Age of Onset  . Depression Mother   . Anxiety disorder Mother   . Alcoholism Mother   . Diabetes Mother   . Hypertension Mother   . Drug abuse Mother   . Lung cancer Father     Social History  Substance Use Topics  . Smoking status: Former Smoker    Quit date: 07/22/2014  . Smokeless tobacco: Never Used  . Alcohol Use: No    Allergies: No Known Allergies  Prescriptions prior to admission  Medication Sig Dispense Refill Last Dose  . acetaminophen (TYLENOL) 500 MG tablet Take 2 tablets (1,000 mg total) by mouth every 8 (eight) hours as needed for mild pain, moderate pain, fever or headache. 90 tablet 0 11/25/2014 at Unknown time  . cloNIDine (CATAPRES) 0.1 MG tablet Take 0.2 mg by mouth 2 (two) times daily. Not sure of does. Takes 2 tablets in am.and at night.   11/25/2014 at Unknown time  . desvenlafaxine (PRISTIQ) 100 MG 24 hr tablet Take 100 mg by mouth daily.   11/25/2014 at Unknown time  .  gabapentin (NEURONTIN) 400 MG capsule Take 400 mg by mouth 3 (three) times daily.   11/25/2014 at Unknown time  . pantoprazole (PROTONIX) 40 MG tablet Take 1 tablet (40 mg total) by mouth daily. 30 tablet 4 11/25/2014 at Unknown time  . Prenatal Multivit-Min-Fe-FA (PRENATAL VITAMINS) 0.8 MG tablet Take 1 tablet by mouth daily. 30 tablet 12 11/25/2014 at Unknown time  . QUEtiapine (SEROQUEL) 200 MG tablet Take 600 mg by mouth at bedtime.   11/24/2014 at Unknown time   Results for orders placed or performed during the hospital encounter of 11/25/14 (from the past 24 hour(s))  Urinalysis, Routine w reflex microscopic (not at [facility])     Status: Abnormal   Collection Time: 11/25/14  3:10 PM  Result Value Ref Range   Color, Urine YELLOW YELLOW   APPearance CLEAR CLEAR   Specific Gravity, Urine >1.030 (H) 1.005 - 1.030   pH 6.5 5.0 - 8.0   Glucose, UA NEGATIVE NEGATIVE mg/dL   Hgb urine dipstick NEGATIVE NEGATIVE   Bilirubin Urine NEGATIVE NEGATIVE   Ketones, ur NEGATIVE NEGATIVE mg/dL   Protein, ur NEGATIVE NEGATIVE mg/dL   Urobilinogen, UA 0.2 0.0 - 1.0 mg/dL   Nitrite  NEGATIVE NEGATIVE   Leukocytes, UA NEGATIVE NEGATIVE    Review of Systems  Genitourinary: Negative for dysuria, urgency and frequency.   Physical Exam   Blood pressure 125/78, pulse 11, temperature 98.6 F (37 C), resp. rate 18, height  (1.702 m), weight 98.884 kg (218 lb), last menstrual period 03/23/2014.  Physical Exam  Constitutional: She is oriented to person, place, and time. She appears well-developed and well-nourished.  Non-toxic appearance. She does not have a sickly appearance. She does not appear ill. No distress.  HENT:  Head: Normocephalic.  Eyes: Pupils are equal, round, and reactive to light.  GI: Soft. There is no tenderness.  Genitourinary:  Dilation: Closed (1 externally closed internally) Effacement (%): Thick Cervical Position: Middle (to maternal left) Station: -3 Exam by:: Laurell Josephs RN   Musculoskeletal: Normal range of motion.  Neurological: She is alert and oriented to person, place, and time.  Skin: She is not diaphoretic.    Fetal Tracing: Baseline: 130 BPM Variability: Moderate  Accelerations: 15x15 Decelerations: 1 variable Toco: 1 contraction   MAU Course  Procedures  None  MDM   Assessment and Plan   A:  1. Pelvic pressure in pregnancy, third trimester    P:  Discharge home in stable condition Kick counts RX: Ambien Return to MAU if symptoms worsen Preterm labor precautions.     Duane Lope, NP 11/25/2014 5:12 PM

## 2014-12-02 ENCOUNTER — Other Ambulatory Visit (HOSPITAL_COMMUNITY)
Admission: RE | Admit: 2014-12-02 | Discharge: 2014-12-02 | Disposition: A | Discharge status: Home / Self Care | Payer: Medicaid Other | Source: Ambulatory Visit | Attending: Family | Admitting: Family

## 2014-12-02 ENCOUNTER — Ambulatory Visit (INDEPENDENT_AMBULATORY_CARE_PROVIDER_SITE_OTHER): Payer: Medicaid Other | Admitting: Family

## 2014-12-02 VITALS — BP 125/87 | HR 104 | Temp 98.3°F | Wt 218.0 lb

## 2014-12-02 DIAGNOSIS — Z3480 Encounter for supervision of other normal pregnancy, unspecified trimester: Secondary | ICD-10-CM

## 2014-12-02 DIAGNOSIS — Z113 Encounter for screening for infections with a predominantly sexual mode of transmission: Secondary | ICD-10-CM | POA: Insufficient documentation

## 2014-12-02 DIAGNOSIS — Z3483 Encounter for supervision of other normal pregnancy, third trimester: Secondary | ICD-10-CM

## 2014-12-02 LAB — OB RESULTS CONSOLE GC/CHLAMYDIA: Gonorrhea: NEGATIVE

## 2014-12-02 LAB — OB RESULTS CONSOLE GBS: GBS: NEGATIVE

## 2014-12-02 NOTE — Addendum Note (Signed)
Addended by: Faythe CasaBELLAMY, Jamaris Biernat M on: 12/02/2014 11:11 AM   Modules accepted: Orders

## 2014-12-02 NOTE — Progress Notes (Signed)
Subjective:  Kristen Howard is a 34 y.o. G3P1011 at 4750w1d being seen today for ongoing prenatal care.  Patient reports round ligament pain.  Contractions: Irregular.  Vag. Bleeding: None. Movement: Present. Denies leaking of fluid.   The following portions of the patient's history were reviewed and updated as appropriate: allergies, current medications, past family history, past medical history, past social history, past surgical history and problem list. Problem list updated.  Objective:   Filed Vitals:   12/02/14 0959  BP: 125/87  Pulse: 104  Temp: 98.3 F (36.8 C)  Weight: 218 lb (98.884 kg)    Fetal Status: Fetal Heart Rate (bpm): 158 Fundal Height: 37 cm Movement: Present     General:  Alert, oriented and cooperative. Patient is in no acute distress.  Skin: Skin is warm and dry. No rash noted.   Cardiovascular: Normal heart rate noted  Respiratory: Normal respiratory effort, no problems with respiration noted  Abdomen: Soft, gravid, appropriate for gestational age. Pain/Pressure: Present     Pelvic: Vag. Bleeding: None Vag D/C Character: Other (Comment)   Cervical exam performed        Extremities: Normal range of motion.  Edema: Trace  Mental Status: Normal mood and affect. Normal behavior. Normal judgment and thought content.   Urinalysis:     Urine results not available at discharge.     Assessment and Plan:  Pregnancy: G3P1011 at 6950w1d  1. Supervision of normal pregnancy, antepartum, unspecified trimester - Wet prep, genital - GC/CT   Preterm labor symptoms and general obstetric precautions including but not limited to vaginal bleeding, contractions, leaking of fluid and fetal movement were reviewed in detail with the patient. Please refer to After Visit Summary for other counseling recommendations.  Return in about 1 week (around 12/09/2014).   Eino FarberWalidah Kennith GainN Karim, CNM

## 2014-12-03 LAB — GC/CHLAMYDIA PROBE AMP (~~LOC~~) NOT AT ARMC
CHLAMYDIA, DNA PROBE: NEGATIVE
Neisseria Gonorrhea: NEGATIVE

## 2014-12-04 LAB — CULTURE, BETA STREP (GROUP B ONLY)

## 2014-12-09 ENCOUNTER — Ambulatory Visit (INDEPENDENT_AMBULATORY_CARE_PROVIDER_SITE_OTHER): Payer: Medicaid Other | Admitting: Advanced Practice Midwife

## 2014-12-09 ENCOUNTER — Telehealth: Payer: Self-pay | Admitting: *Deleted

## 2014-12-09 VITALS — BP 122/89 | HR 117 | Temp 98.6°F | Wt 216.5 lb

## 2014-12-09 DIAGNOSIS — Z3483 Encounter for supervision of other normal pregnancy, third trimester: Secondary | ICD-10-CM

## 2014-12-09 LAB — POCT URINALYSIS DIP (DEVICE)
Glucose, UA: NEGATIVE mg/dL
HGB URINE DIPSTICK: NEGATIVE
NITRITE: NEGATIVE
PH: 7 (ref 5.0–8.0)
PROTEIN: 30 mg/dL — AB
SPECIFIC GRAVITY, URINE: 1.025 (ref 1.005–1.030)
UROBILINOGEN UA: 1 mg/dL (ref 0.0–1.0)

## 2014-12-09 MED ORDER — ZOLPIDEM TARTRATE 5 MG PO TABS: 5.0000 mg | ORAL_TABLET | Freq: Every evening | ORAL | Status: AC | PRN

## 2014-12-09 NOTE — Patient Instructions (Signed)
Braxton Hicks Contractions °Contractions of the uterus can occur throughout pregnancy. Contractions are not always a sign that you are in labor.  °WHAT ARE BRAXTON HICKS CONTRACTIONS?  °Contractions that occur before labor are called Braxton Hicks contractions, or false labor. Toward the end of pregnancy (32-34 weeks), these contractions can develop more often and may become more forceful. This is not true labor because these contractions do not result in opening (dilatation) and thinning of the cervix. They are sometimes difficult to tell apart from true labor because these contractions can be forceful and people have different pain tolerances. You should not feel embarrassed if you go to the hospital with false labor. Sometimes, the only way to tell if you are in true labor is for your health care provider to look for changes in the cervix. °If there are no prenatal problems or other health problems associated with the pregnancy, it is completely safe to be sent home with false labor and await the onset of true labor. °HOW CAN YOU TELL THE DIFFERENCE BETWEEN TRUE AND FALSE LABOR? °False Labor °· The contractions of false labor are usually shorter and not as hard as those of true labor.   °· The contractions are usually irregular.   °· The contractions are often felt in the front of the lower abdomen and in the groin.   °· The contractions may go away when you walk around or change positions while lying down.   °· The contractions get weaker and are shorter lasting as time goes on.   °· The contractions do not usually become progressively stronger, regular, and closer together as with true labor.   °True Labor °· Contractions in true labor last 30-70 seconds, become very regular, usually become more intense, and increase in frequency.   °· The contractions do not go away with walking.   °· The discomfort is usually felt in the top of the uterus and spreads to the lower abdomen and low back.   °· True labor can be  determined by your health care provider with an exam. This will show that the cervix is dilating and getting thinner.   °WHAT TO REMEMBER °· Keep up with your usual exercises and follow other instructions given by your health care provider.   °· Take medicines as directed by your health care provider.   °· Keep your regular prenatal appointments.   °· Eat and drink lightly if you think you are going into labor.   °· If Braxton Hicks contractions are making you uncomfortable:   °¨ Change your position from lying down or resting to walking, or from walking to resting.   °¨ Sit and rest in a tub of warm water.   °¨ Drink 2-3 glasses of water. Dehydration may cause these contractions.   °¨ Do slow and deep breathing several times an hour.   °WHEN SHOULD I SEEK IMMEDIATE MEDICAL CARE? °Seek immediate medical care if: °· Your contractions become stronger, more regular, and closer together.   °· You have fluid leaking or gushing from your vagina.   °· You have a fever.   °· You pass blood-tinged mucus.   °· You have vaginal bleeding.   °· You have continuous abdominal pain.   °· You have low back pain that you never had before.   °· You feel your baby's head pushing down and causing pelvic pressure.   °· Your baby is not moving as much as it used to.   °  °This information is not intended to replace advice given to you by your health care provider. Make sure you discuss any questions you have with your health care   provider. °  °Document Released: 02/06/2005 Document Revised: 02/11/2013 Document Reviewed: 11/18/2012 °Elsevier Interactive Patient Education ©2016 [employer] ° °

## 2014-12-09 NOTE — Progress Notes (Signed)
Subjective:  Kristen Howard is a 34 y.o. G3P1011 at 2181w1d being seen today for ongoing prenatal care.  Patient reports occasional contractions.  Contractions: Irregular.  Vag. Bleeding: None. Movement: Present. Denies leaking of fluid.   The following portions of the patient's history were reviewed and updated as appropriate: allergies, current medications, past family history, past medical history, past social history, past surgical history and problem list. Problem list updated.  Objective:   Filed Vitals:   12/09/14 1119  BP: 122/89  Pulse: 117  Temp: 98.6 F (37 C)  Weight: 216 lb 8 oz (98.204 kg)    Fetal Status: Fetal Heart Rate (bpm): 149   Movement: Present     General:  Alert, oriented and cooperative. Patient is in no acute distress.  Skin: Skin is warm and dry. No rash noted.   Cardiovascular: Normal heart rate noted  Respiratory: Normal respiratory effort, no problems with respiration noted  Abdomen: Soft, gravid, appropriate for gestational age. Pain/Pressure: Present     Pelvic: Vag. Bleeding: None Vag D/C Character: Mucous   Cervical exam deferred        Extremities: Normal range of motion.  Edema: Trace  Mental Status: Normal mood and affect. Normal behavior. Normal judgment and thought content.   Urinalysis: Urine Protein: Trace Urine Glucose: Trace  Assessment and Plan:  Pregnancy: G3P1011 at 6681w1d  1. Supervision of normal pregnancy, antepartum, third trimester   Term labor symptoms and general obstetric precautions including but not limited to vaginal bleeding, contractions, leaking of fluid and fetal movement were reviewed in detail with the patient. Please refer to After Visit Summary for other counseling recommendations.  Return in about 1 week (around 12/16/2014).   Dorathy KinsmanVirginia Melvern Ramone, CNM

## 2014-12-09 NOTE — Telephone Encounter (Signed)
Received message left on nurse line 12/09/14 at 1530.  Velna HatchetSheila with [facility].  States [place] wrote prescription for patient for Ambien 5 mg.  Patient has Medicaid and it will not be covered without prior authorization.  Spoke with Dr. Shawnie PonsPratt.  Recommends patient try benadryl 25 mg PO at bedtime daily.  Attempted to contact pharmacy.  No answer.  No voicemail to leave message.

## 2014-12-09 NOTE — Progress Notes (Signed)
Breastfeeding tip of the week reviewed Patient reports decreased appetite Urine: trace ketones, trace leukocytes

## 2014-12-10 NOTE — Telephone Encounter (Signed)
Called patient back and she stated that she paid out of pocket to get the Ambien.

## 2014-12-16 ENCOUNTER — Ambulatory Visit (INDEPENDENT_AMBULATORY_CARE_PROVIDER_SITE_OTHER): Payer: Medicaid Other | Admitting: Family

## 2014-12-16 VITALS — BP 128/88 | HR 108 | Temp 98.2°F | Wt 226.0 lb

## 2014-12-16 DIAGNOSIS — Z3483 Encounter for supervision of other normal pregnancy, third trimester: Secondary | ICD-10-CM

## 2014-12-16 DIAGNOSIS — O0993 Supervision of high risk pregnancy, unspecified, third trimester: Secondary | ICD-10-CM

## 2014-12-16 LAB — POCT URINALYSIS DIP (DEVICE)
BILIRUBIN URINE: NEGATIVE
Glucose, UA: NEGATIVE mg/dL
Hgb urine dipstick: NEGATIVE
Ketones, ur: NEGATIVE mg/dL
NITRITE: NEGATIVE
PH: 7 (ref 5.0–8.0)
Protein, ur: NEGATIVE mg/dL
Specific Gravity, Urine: 1.02 (ref 1.005–1.030)
UROBILINOGEN UA: 0.2 mg/dL (ref 0.0–1.0)

## 2014-12-16 NOTE — Progress Notes (Signed)
Subjective:  Kristen Howard is a 34 y.o. G3P1011 at 1864w1d being seen today for ongoing prenatal care.  Patient reports contractions every 25 min last night. decreased with shower.  Contractions: Irregular.  Vag. Bleeding: None. Movement: Present. Denies leaking of fluid.   The following portions of the patient's history were reviewed and updated as appropriate: allergies, current medications, past family history, past medical history, past social history, past surgical history and problem list. Problem list updated.  Objective:   Filed Vitals:   12/16/14 1053  BP: 128/88  Pulse: 108  Temp: 98.2 F (36.8 C)  Weight: 226 lb (102.513 kg)    Fetal Status: Fetal Heart Rate (bpm): 147   Movement: Present     General:  Alert, oriented and cooperative. Patient is in no acute distress.  Skin: Skin is warm and dry. No rash noted.   Cardiovascular: Normal heart rate noted  Respiratory: Normal respiratory effort, no problems with respiration noted  Abdomen: Soft, gravid, appropriate for gestational age. Pain/Pressure: Present     Pelvic: Vag. Bleeding: None Vag D/C Character: White   Cervical exam performed      FT/thick  Extremities: Normal range of motion.  Edema: Mild pitting, slight indentation  Mental Status: Normal mood and affect. Normal behavior. Normal judgment and thought content.   Urinalysis: Urine Protein: Negative Urine Glucose: Negative  Assessment and Plan:  Pregnancy: G3P1011 at 9864w1d  1. Supervision of high risk pregnancy, antepartum, third trimester - Reviewed signs of labor  Term labor symptoms and general obstetric precautions including but not limited to vaginal bleeding, contractions, leaking of fluid and fetal movement were reviewed in detail with the patient. Please refer to After Visit Summary for other counseling recommendations.  Return in about 1 week (around 12/23/2014).   Eino FarberWalidah Kennith GainN Karim, CNM

## 2014-12-23 ENCOUNTER — Ambulatory Visit (INDEPENDENT_AMBULATORY_CARE_PROVIDER_SITE_OTHER): Payer: Medicaid Other | Admitting: Advanced Practice Midwife

## 2014-12-23 VITALS — BP 126/79 | HR 102 | Temp 98.6°F | Wt 225.3 lb

## 2014-12-23 DIAGNOSIS — Z3483 Encounter for supervision of other normal pregnancy, third trimester: Secondary | ICD-10-CM

## 2014-12-23 LAB — POCT URINALYSIS DIP (DEVICE)
Glucose, UA: NEGATIVE mg/dL
HGB URINE DIPSTICK: NEGATIVE
KETONES UR: NEGATIVE mg/dL
Leukocytes, UA: NEGATIVE
Nitrite: NEGATIVE
PH: 7 (ref 5.0–8.0)
PROTEIN: NEGATIVE mg/dL
Specific Gravity, Urine: 1.025 (ref 1.005–1.030)
Urobilinogen, UA: 1 mg/dL (ref 0.0–1.0)

## 2014-12-23 NOTE — Progress Notes (Signed)
Subjective:  Kristen Howard is a 34 y.o. G3P1011 at 7191w1d being seen today for ongoing prenatal care.  Patient reports intermittent contractions that are irregular.  Contractions: Irregular.  Vag. Bleeding: None. Movement: Present. Denies leaking of fluid.   The following portions of the patient's history were reviewed and updated as appropriate: allergies, current medications, past family history, past medical history, past social history, past surgical history and problem list. Problem list updated.  Objective:   Filed Vitals:   12/23/14 1045  BP: 126/79  Pulse: 102  Temp: 98.6 F (37 C)  Weight: 225 lb 4.8 oz (102.195 kg)    Fetal Status: Fetal Heart Rate (bpm): 122   Movement: Present  Presentation: Vertex  General:  Alert, oriented and cooperative. Patient is in no acute distress.  Skin: Skin is warm and dry. No rash noted.   Cardiovascular: Normal heart rate noted  Respiratory: Normal respiratory effort, no problems with respiration noted  Abdomen: Soft, gravid, appropriate for gestational age. Pain/Pressure: Present     Pelvic: Vag. Bleeding: None Vag D/C Character: White   Cervical exam performed Dilation: 2.5 Effacement (%): 50 Station: -3  Extremities: Normal range of motion.  Edema: Mild pitting, slight indentation  Mental Status: Normal mood and affect. Normal behavior. Normal judgment and thought content.   Urinalysis:      Assessment and Plan:  Pregnancy: G3P1011 at 4291w1d   Membranes swept at today's visit at pt request.  Discussed maternal positions to encourage optimum fetal positioning.     Term labor symptoms and general obstetric precautions including but not limited to vaginal bleeding, contractions, leaking of fluid and fetal movement were reviewed in detail with the patient. Please refer to After Visit Summary for other counseling recommendations.  Return in about 1 week (around 12/30/2014).   Hurshel PartyLisa A Leftwich-Kirby, CNM

## 2014-12-23 NOTE — Progress Notes (Signed)
Scheduled for IOL

## 2014-12-24 ENCOUNTER — Encounter: Payer: Self-pay | Admitting: *Deleted

## 2014-12-25 ENCOUNTER — Inpatient Hospital Stay (HOSPITAL_COMMUNITY)
Admission: AD | Admit: 2014-12-25 | Discharge: 2014-12-25 | Disposition: A | Discharge status: Home / Self Care | Payer: Medicaid Other | Source: Ambulatory Visit | Attending: Obstetrics & Gynecology | Admitting: Obstetrics & Gynecology

## 2014-12-25 ENCOUNTER — Encounter (HOSPITAL_COMMUNITY): Payer: Self-pay | Admitting: *Deleted

## 2014-12-25 DIAGNOSIS — Z3493 Encounter for supervision of normal pregnancy, unspecified, third trimester: Secondary | ICD-10-CM | POA: Diagnosis not present

## 2014-12-25 DIAGNOSIS — O0993 Supervision of high risk pregnancy, unspecified, third trimester: Secondary | ICD-10-CM

## 2014-12-25 NOTE — MAU Note (Addendum)
Having contractions after membranes stripped in office Wed. Back pain, pressure, and pinkish discharge. No bleeding or clear fluid leaking

## 2014-12-25 NOTE — Discharge Instructions (Signed)
Braxton Hicks Contractions °Contractions of the uterus can occur throughout pregnancy. Contractions are not always a sign that you are in labor.  °WHAT ARE BRAXTON HICKS CONTRACTIONS?  °Contractions that occur before labor are called Braxton Hicks contractions, or false labor. Toward the end of pregnancy (32-34 weeks), these contractions can develop more often and may become more forceful. This is not true labor because these contractions do not result in opening (dilatation) and thinning of the cervix. They are sometimes difficult to tell apart from true labor because these contractions can be forceful and people have different pain tolerances. You should not feel embarrassed if you go to the hospital with false labor. Sometimes, the only way to tell if you are in true labor is for your health care provider to look for changes in the cervix. °If there are no prenatal problems or other health problems associated with the pregnancy, it is completely safe to be sent home with false labor and await the onset of true labor. °HOW CAN YOU TELL THE DIFFERENCE BETWEEN TRUE AND FALSE LABOR? °False Labor °· The contractions of false labor are usually shorter and not as hard as those of true labor.   °· The contractions are usually irregular.   °· The contractions are often felt in the front of the lower abdomen and in the groin.   °· The contractions may go away when you walk around or change positions while lying down.   °· The contractions get weaker and are shorter lasting as time goes on.   °· The contractions do not usually become progressively stronger, regular, and closer together as with true labor.   °True Labor °· Contractions in true labor last 30-70 seconds, become very regular, usually become more intense, and increase in frequency.   °· The contractions do not go away with walking.   °· The discomfort is usually felt in the top of the uterus and spreads to the lower abdomen and low back.   °· True labor can be  determined by your health care provider with an exam. This will show that the cervix is dilating and getting thinner.   °WHAT TO REMEMBER °· Keep up with your usual exercises and follow other instructions given by your health care provider.   °· Take medicines as directed by your health care provider.   °· Keep your regular prenatal appointments.   °· Eat and drink lightly if you think you are going into labor.   °· If Braxton Hicks contractions are making you uncomfortable:   °¨ Change your position from lying down or resting to walking, or from walking to resting.   °¨ Sit and rest in a tub of warm water.   °¨ Drink 2-3 glasses of water. Dehydration may cause these contractions.   °¨ Do slow and deep breathing several times an hour.   °WHEN SHOULD I SEEK IMMEDIATE MEDICAL CARE? °Seek immediate medical care if: °· Your contractions become stronger, more regular, and closer together.   °· You have fluid leaking or gushing from your vagina.   °· You have a fever.   °· You pass blood-tinged mucus.   °· You have vaginal bleeding.   °· You have continuous abdominal pain.   °· You have low back pain that you never had before.   °· You feel your baby's head pushing down and causing pelvic pressure.   °· Your baby is not moving as much as it used to.   °  °This information is not intended to replace advice given to you by your health care provider. Make sure you discuss any questions you have with your health care   provider. °  °Document Released: 02/06/2005 Document Revised: 02/11/2013 Document Reviewed: 11/18/2012 °Elsevier Interactive Patient Education ©2016 [employer] ° ° ° °Third Trimester of Pregnancy °The third trimester is from week 29 through week 42, months 7 through 9. The third trimester is a time when the fetus is growing rapidly. At the end of the ninth month, the fetus is about 20 inches in length and weighs 6-10 pounds.  °BODY CHANGES °Your body goes through many changes during pregnancy. The changes vary  from woman to woman.  °· Your weight will continue to increase. You can expect to gain 25-35 pounds (11-16 kg) by the end of the pregnancy. °· You may begin to get stretch marks on your hips, abdomen, and breasts. °· You may urinate more often because the fetus is moving lower into your pelvis and pressing on your bladder. °· You may develop or continue to have heartburn as a result of your pregnancy. °· You may develop constipation because certain hormones are causing the muscles that push waste through your intestines to slow down. °· You may develop hemorrhoids or swollen, bulging veins (varicose veins). °· You may have pelvic pain because of the weight gain and pregnancy hormones relaxing your joints between the bones in your pelvis. Backaches may result from overexertion of the muscles supporting your posture. °· You may have changes in your hair. These can include thickening of your hair, rapid growth, and changes in texture. Some women also have hair loss during or after pregnancy, or hair that feels dry or thin. Your hair will most likely return to normal after your baby is born. °· Your breasts will continue to grow and be tender. A yellow discharge may leak from your breasts called colostrum. °· Your belly button may stick out. °· You may feel short of breath because of your expanding uterus. °· You may notice the fetus "dropping," or moving lower in your abdomen. °· You may have a bloody mucus discharge. This usually occurs a few days to a week before labor begins. °· Your cervix becomes thin and soft (effaced) near your due date. °WHAT TO EXPECT AT YOUR PRENATAL EXAMS  °You will have prenatal exams every 2 weeks until week 36. Then, you will have weekly prenatal exams. During a routine prenatal visit: °· You will be weighed to make sure you and the fetus are growing normally. °· Your blood pressure is taken. °· Your abdomen will be measured to track your baby's growth. °· The fetal heartbeat will be  listened to. °· Any test results from the previous visit will be discussed. °· You may have a cervical check near your due date to see if you have effaced. °At around 36 weeks, your caregiver will check your cervix. At the same time, your caregiver will also perform a test on the secretions of the vaginal tissue. This test is to determine if a type of bacteria, Group B streptococcus, is present. Your caregiver will explain this further. °Your caregiver may ask you: °· What your birth plan is. °· How you are feeling. °· If you are feeling the baby move. °· If you have had any abnormal symptoms, such as leaking fluid, bleeding, severe headaches, or abdominal cramping. °· If you are using any tobacco products, including cigarettes, chewing tobacco, and electronic cigarettes. °· If you have any questions. °Other tests or screenings that may be performed during your third trimester include: °· Blood tests that check for low iron levels (anemia). °· Fetal   testing to check the health, activity level, and growth of the fetus. Testing is done if you have certain medical conditions or if there are problems during the pregnancy. °· HIV (human immunodeficiency virus) testing. If you are at high risk, you may be screened for HIV during your third trimester of pregnancy. °FALSE LABOR °You may feel small, irregular contractions that eventually go away. These are called Braxton Hicks contractions, or false labor. Contractions may last for hours, days, or even weeks before true labor sets in. If contractions come at regular intervals, intensify, or become painful, it is best to be seen by your caregiver.  °SIGNS OF LABOR  °· Menstrual-like cramps. °· Contractions that are 5 minutes apart or less. °· Contractions that start on the top of the uterus and spread down to the lower abdomen and back. °· A sense of increased pelvic pressure or back pain. °· A watery or bloody mucus discharge that comes from the vagina. °If you have any of  these signs before the 37th week of pregnancy, call your caregiver right away. You need to go to the hospital to get checked immediately. °HOME CARE INSTRUCTIONS  °· Avoid all smoking, herbs, alcohol, and unprescribed drugs. These chemicals affect the formation and growth of the baby. °· Do not use any tobacco products, including cigarettes, chewing tobacco, and electronic cigarettes. If you need help quitting, ask your health care provider. You may receive counseling support and other resources to help you quit. °· Follow your caregiver's instructions regarding medicine use. There are medicines that are either safe or unsafe to take during pregnancy. °· Exercise only as directed by your caregiver. Experiencing uterine cramps is a good sign to stop exercising. °· Continue to eat regular, healthy meals. °· Wear a good support bra for breast tenderness. °· Do not use hot tubs, steam rooms, or saunas. °· Wear your seat belt at all times when driving. °· Avoid raw meat, uncooked cheese, cat litter boxes, and soil used by cats. These carry germs that can cause birth defects in the baby. °· Take your prenatal vitamins. °· Take 1500-2000 mg of calcium daily starting at the 20th week of pregnancy until you deliver your baby. °· Try taking a stool softener (if your caregiver approves) if you develop constipation. Eat more high-fiber foods, such as fresh vegetables or fruit and whole grains. Drink plenty of fluids to keep your urine clear or pale yellow. °· Take warm sitz baths to soothe any pain or discomfort caused by hemorrhoids. Use hemorrhoid cream if your caregiver approves. °· If you develop varicose veins, wear support hose. Elevate your feet for 15 minutes, 3-4 times a day. Limit salt in your diet. °· Avoid heavy lifting, wear low heal shoes, and practice good posture. °· Rest a lot with your legs elevated if you have leg cramps or low back pain. °· Visit your dentist if you have not gone during your pregnancy. Use a  soft toothbrush to brush your teeth and be gentle when you floss. °· A sexual relationship may be continued unless your caregiver directs you otherwise. °· Do not travel far distances unless it is absolutely necessary and only with the approval of your caregiver. °· Take prenatal classes to understand, practice, and ask questions about the labor and delivery. °· Make a trial run to the hospital. °· Pack your hospital bag. °· Prepare the baby's nursery. °· Continue to go to all your prenatal visits as directed by your caregiver. °SEEK MEDICAL CARE   IF: °· You are unsure if you are in labor or if your water has broken. °· You have dizziness. °· You have mild pelvic cramps, pelvic pressure, or nagging pain in your abdominal area. °· You have persistent nausea, vomiting, or diarrhea. °· You have a bad smelling vaginal discharge. °· You have pain with urination. °SEEK IMMEDIATE MEDICAL CARE IF:  °· You have a fever. °· You are leaking fluid from your vagina. °· You have spotting or bleeding from your vagina. °· You have severe abdominal cramping or pain. °· You have rapid weight loss or gain. °· You have shortness of breath with chest pain. °· You notice sudden or extreme swelling of your face, hands, ankles, feet, or legs. °· You have not felt your baby move in over an hour. °· You have severe headaches that do not go away with medicine. °· You have vision changes. °  °This information is not intended to replace advice given to you by your health care provider. Make sure you discuss any questions you have with your health care provider. °  °Document Released: 01/31/2001 Document Revised: 02/27/2014 Document Reviewed: 04/09/2012 °Elsevier Interactive Patient Education ©2016 [employer] Inc[employer] ° °

## 2014-12-30 ENCOUNTER — Inpatient Hospital Stay (HOSPITAL_COMMUNITY): Payer: Medicaid Other | Admitting: Anesthesiology

## 2014-12-30 ENCOUNTER — Inpatient Hospital Stay (HOSPITAL_COMMUNITY)
Admission: AD | Admit: 2014-12-30 | Discharge: 2015-01-01 | DRG: 775 | Disposition: A | Discharge status: Home / Self Care | Payer: Medicaid Other | Source: Ambulatory Visit | Attending: Family Medicine | Admitting: Family Medicine

## 2014-12-30 ENCOUNTER — Encounter (HOSPITAL_COMMUNITY): Payer: Self-pay

## 2014-12-30 ENCOUNTER — Ambulatory Visit (INDEPENDENT_AMBULATORY_CARE_PROVIDER_SITE_OTHER): Payer: Medicaid Other | Admitting: Certified Nurse Midwife

## 2014-12-30 ENCOUNTER — Encounter: Payer: Self-pay | Admitting: Certified Nurse Midwife

## 2014-12-30 VITALS — BP 135/79 | HR 109 | Wt 227.0 lb

## 2014-12-30 DIAGNOSIS — O0993 Supervision of high risk pregnancy, unspecified, third trimester: Secondary | ICD-10-CM

## 2014-12-30 DIAGNOSIS — Z3A40 40 weeks gestation of pregnancy: Secondary | ICD-10-CM | POA: Diagnosis not present

## 2014-12-30 DIAGNOSIS — Z8249 Family history of ischemic heart disease and other diseases of the circulatory system: Secondary | ICD-10-CM

## 2014-12-30 DIAGNOSIS — Z833 Family history of diabetes mellitus: Secondary | ICD-10-CM

## 2014-12-30 DIAGNOSIS — IMO0001 Reserved for inherently not codable concepts without codable children: Secondary | ICD-10-CM

## 2014-12-30 DIAGNOSIS — O99343 Other mental disorders complicating pregnancy, third trimester: Secondary | ICD-10-CM

## 2014-12-30 DIAGNOSIS — O480 Post-term pregnancy: Secondary | ICD-10-CM

## 2014-12-30 DIAGNOSIS — F319 Bipolar disorder, unspecified: Secondary | ICD-10-CM | POA: Diagnosis present

## 2014-12-30 DIAGNOSIS — K219 Gastro-esophageal reflux disease without esophagitis: Secondary | ICD-10-CM | POA: Diagnosis present

## 2014-12-30 DIAGNOSIS — O9962 Diseases of the digestive system complicating childbirth: Secondary | ICD-10-CM | POA: Diagnosis present

## 2014-12-30 DIAGNOSIS — O99344 Other mental disorders complicating childbirth: Secondary | ICD-10-CM | POA: Diagnosis present

## 2014-12-30 DIAGNOSIS — Z87891 Personal history of nicotine dependence: Secondary | ICD-10-CM | POA: Diagnosis not present

## 2014-12-30 DIAGNOSIS — O99342 Other mental disorders complicating pregnancy, second trimester: Secondary | ICD-10-CM

## 2014-12-30 DIAGNOSIS — Z3482 Encounter for supervision of other normal pregnancy, second trimester: Secondary | ICD-10-CM

## 2014-12-30 LAB — POCT URINALYSIS DIP (DEVICE)
Bilirubin Urine: NEGATIVE
Glucose, UA: NEGATIVE mg/dL
Nitrite: NEGATIVE
Protein, ur: 30 mg/dL — AB
Specific Gravity, Urine: 1.025 (ref 1.005–1.030)
Urobilinogen, UA: 0.2 mg/dL (ref 0.0–1.0)
pH: 7 (ref 5.0–8.0)

## 2014-12-30 LAB — ABO/RH: ABO/RH(D): O POS

## 2014-12-30 LAB — TYPE AND SCREEN
ABO/RH(D): O POS
ANTIBODY SCREEN: NEGATIVE

## 2014-12-30 LAB — CBC
HEMATOCRIT: 33.2 % — AB (ref 36.0–46.0)
HEMOGLOBIN: 11.1 g/dL — AB (ref 12.0–15.0)
MCH: 31.4 pg (ref 26.0–34.0)
MCHC: 33.4 g/dL (ref 30.0–36.0)
MCV: 93.8 fL (ref 78.0–100.0)
Platelets: 230 10*3/uL (ref 150–400)
RBC: 3.54 MIL/uL — ABNORMAL LOW (ref 3.87–5.11)
RDW: 14.6 % (ref 11.5–15.5)
WBC: 14.6 10*3/uL — AB (ref 4.0–10.5)

## 2014-12-30 MED ORDER — OXYCODONE-ACETAMINOPHEN 5-325 MG PO TABS: 2.0000 | ORAL_TABLET | ORAL | Status: DC | PRN

## 2014-12-30 MED ORDER — LACTATED RINGERS IV SOLN
INTRAVENOUS | Status: DC
  Administered 2014-12-30 (×2): via INTRAVENOUS

## 2014-12-30 MED ORDER — LIDOCAINE HCL (PF) 1 % IJ SOLN
INTRAMUSCULAR | Status: DC | PRN
  Administered 2014-12-30 (×2): 4 mL via EPIDURAL

## 2014-12-30 MED ORDER — OXYTOCIN 40 UNITS IN LACTATED RINGERS INFUSION - SIMPLE MED
62.5000 mL/h | INTRAVENOUS | Status: DC
  Filled 2014-12-30: qty 1000

## 2014-12-30 MED ORDER — ACETAMINOPHEN 325 MG PO TABS: 650.0000 mg | ORAL_TABLET | ORAL | Status: DC | PRN

## 2014-12-30 MED ORDER — FENTANYL 2.5 MCG/ML BUPIVACAINE 1/10 % EPIDURAL INFUSION (WH - ANES)
INTRAMUSCULAR | Status: AC
  Administered 2014-12-30: 14 mL/h via EPIDURAL
  Filled 2014-12-30: qty 125

## 2014-12-30 MED ORDER — LACTATED RINGERS IV SOLN: 500.0000 mL | INTRAVENOUS | Status: DC | PRN

## 2014-12-30 MED ORDER — PHENYLEPHRINE 40 MCG/ML (10ML) SYRINGE FOR IV PUSH (FOR BLOOD PRESSURE SUPPORT)
PREFILLED_SYRINGE | INTRAVENOUS | Status: DC
Start: 2014-12-30 — End: 2014-12-30
  Filled 2014-12-30: qty 20

## 2014-12-30 MED ORDER — FENTANYL 2.5 MCG/ML BUPIVACAINE 1/10 % EPIDURAL INFUSION (WH - ANES)
14.0000 mL/h | INTRAMUSCULAR | Status: DC | PRN
Start: 2014-12-30 — End: 2014-12-31
  Administered 2014-12-30 (×3): 14 mL/h via EPIDURAL
  Filled 2014-12-30: qty 125

## 2014-12-30 MED ORDER — EPHEDRINE 5 MG/ML INJ
10.0000 mg | INTRAVENOUS | Status: DC | PRN
  Filled 2014-12-30: qty 2

## 2014-12-30 MED ORDER — OXYTOCIN BOLUS FROM INFUSION
500.0000 mL | INTRAVENOUS | Status: DC
  Administered 2014-12-30: 500 mL via INTRAVENOUS

## 2014-12-30 MED ORDER — ONDANSETRON HCL 4 MG/2ML IJ SOLN: 4.0000 mg | Freq: Four times a day (QID) | INTRAMUSCULAR | Status: DC | PRN

## 2014-12-30 MED ORDER — DIPHENHYDRAMINE HCL 50 MG/ML IJ SOLN: 12.5000 mg | INTRAMUSCULAR | Status: DC | PRN

## 2014-12-30 MED ORDER — QUETIAPINE FUMARATE 300 MG PO TABS
600.0000 mg | ORAL_TABLET | Freq: Every day | ORAL | Status: DC
Start: 2014-12-30 — End: 2014-12-31
  Administered 2014-12-30: 600 mg via ORAL
  Filled 2014-12-30: qty 2

## 2014-12-30 MED ORDER — CITRIC ACID-SODIUM CITRATE 334-500 MG/5ML PO SOLN: 30.0000 mL | ORAL | Status: DC | PRN

## 2014-12-30 MED ORDER — LIDOCAINE HCL (PF) 1 % IJ SOLN
30.0000 mL | INTRAMUSCULAR | Status: AC | PRN
  Administered 2014-12-31: 30 mL via SUBCUTANEOUS
  Filled 2014-12-30: qty 30

## 2014-12-30 MED ORDER — VENLAFAXINE HCL ER 150 MG PO CP24
150.0000 mg | ORAL_CAPSULE | Freq: Every day | ORAL | Status: DC
  Filled 2014-12-30: qty 1

## 2014-12-30 MED ORDER — PHENYLEPHRINE 40 MCG/ML (10ML) SYRINGE FOR IV PUSH (FOR BLOOD PRESSURE SUPPORT)
80.0000 ug | PREFILLED_SYRINGE | INTRAVENOUS | Status: DC | PRN
  Filled 2014-12-30: qty 2

## 2014-12-30 MED ORDER — OXYCODONE-ACETAMINOPHEN 5-325 MG PO TABS: 1.0000 | ORAL_TABLET | ORAL | Status: DC | PRN

## 2014-12-30 NOTE — MAU Note (Signed)
Notified provider that patient want 4/50 in the office this morning I checked her and she was 6/80/-2. Provider said she would put in admit orders.

## 2014-12-30 NOTE — MAU Note (Addendum)
Patient presents with ctx starting this morning at 0700 and pinkish discharge. She went to her appointment this morning and they were 8 mins apart and now they are 5-6 mins apart.

## 2014-12-30 NOTE — Progress Notes (Signed)
Subjective:  Kristen Howard is a 34 y.o. G3P1011 at 5257w1d being seen today for ongoing prenatal care.  Patient reports contractions since 7am.  Contractions Regular, 7mins apart increasing frequency.  Vag. Bleeding: Scant. Movement: Present. Denies leaking of fluid. Some blood spotting seen.   The following portions of the patient's history were reviewed and updated as appropriate: allergies, current medications, past family history, past medical history, past social history, past surgical history and problem list. Problem list updated.  Objective:   Filed Vitals:   12/30/14 1023  BP: 135/79  Pulse: 109  Weight: 227 lb (102.967 kg)    Fetal Status:     Movement: Present     General:  Alert, oriented and cooperative. Patient is in no acute distress.  Skin: Skin is warm and dry. No rash noted.   Cardiovascular: Normal heart rate noted  Respiratory: Normal respiratory effort, no problems with respiration noted  Abdomen: Soft, gravid, appropriate for gestational age. Pain/Pressure: Present     Pelvic: Vag. Bleeding: Scant Vag D/C Character: Mucous   Cervical exam performed 3.5/50/-3  Extremities: Normal range of motion.  Edema: Trace  Mental Status: Normal mood and affect. Normal behavior. Normal judgment and thought content.   Urinalysis: Urine Protein: 1+ Urine Glucose: Negative   Assessment and Plan:  Pregnancy: G3P1011 at 7357w1d  1. Early labor: Patient stable - Patient counseled on return precautions and told to go directly to MAU. - patient expresses understanding of return precautions.   2. Post-term pregnancy, 40-42 weeks of gestation - Fetal nonstress test: Category 1: Reactive. Contractions every 7 minutes.   3. Proteinuria 1+ - Patient presents in early labor. BP not >140/90. Patient exhibits no concerning signs and symptoms. - Work up deferred.    Term labor symptoms and general obstetric precautions including but not limited to vaginal bleeding, contractions,  leaking of fluid and fetal movement were reviewed in detail with the patient. Please refer to After Visit Summary for other counseling recommendations.  Return in about 2 days (around 01/01/2015) for as scheduled.   Janett LabellaJohannes L Du Pisanie, Med Student

## 2014-12-30 NOTE — Anesthesia Procedure Notes (Signed)
Epidural Patient location during procedure: OB Start time: 12/30/2014 4:51 PM  Staffing Anesthesiologist: Mal AmabileFOSTER, Nairi Oswald Performed by: anesthesiologist   Preanesthetic Checklist Completed: patient identified, site marked, surgical consent, pre-op evaluation, timeout performed, IV checked, risks and benefits discussed and monitors and equipment checked  Epidural Patient position: sitting Prep: site prepped and draped and DuraPrep Patient monitoring: continuous pulse ox and blood pressure Approach: midline Location: L3-L4 Injection technique: LOR air  Needle:  Needle type: Tuohy  Needle gauge: 17 G Needle length: 9 cm and 9 Needle insertion depth: 5 cm cm Catheter type: closed end flexible Catheter size: 19 Gauge Catheter at skin depth: 10 cm Test dose: negative and Other  Assessment Events: blood not aspirated, injection not painful, no injection resistance, negative IV test and no paresthesia  Additional Notes Patient identified. Risks and benefits discussed including failed block, incomplete  Pain control, post dural puncture headache, nerve damage, paralysis, blood pressure Changes, nausea, vomiting, reactions to medications-both toxic and allergic and post Partum back pain. All questions were answered. Patient expressed understanding and wished to proceed. Sterile technique was used throughout procedure. Epidural site was Dressed with sterile barrier dressing. No paresthesias, signs of intravascular injection Or signs of intrathecal spread were encountered.  Patient was more comfortable after the epidural was dosed. Please see RN's note for documentation of vital signs and FHR which are stable.

## 2014-12-30 NOTE — Anesthesia Preprocedure Evaluation (Signed)
Anesthesia Evaluation  Patient identified by MRN, date of birth, ID band Patient awake    Reviewed: Allergy & Precautions, Patient's Chart, lab work & pertinent test results  Airway Mallampati: III  TM Distance: >3 FB Neck ROM: Full    Dental no notable dental hx. (+) Teeth Intact   Pulmonary former smoker,    Pulmonary exam normal breath sounds clear to auscultation       Cardiovascular negative cardio ROS Normal cardiovascular exam Rhythm:Regular Rate:Normal     Neuro/Psych PSYCHIATRIC DISORDERS Bipolar Disorder Manic Depressionnegative neurological ROS     GI/Hepatic Neg liver ROS, GERD  Medicated and Controlled,  Endo/Other  Obesity  Renal/GU negative Renal ROS  negative genitourinary   Musculoskeletal negative musculoskeletal ROS (+)   Abdominal (+) + obese,   Peds  Hematology  (+) anemia ,   Anesthesia Other Findings   Reproductive/Obstetrics (+) Pregnancy                             Anesthesia Physical Anesthesia Plan  ASA: II  Anesthesia Plan: Epidural   Post-op Pain Management:    Induction:   Airway Management Planned: Natural Airway  Additional Equipment:   Intra-op Plan:   Post-operative Plan:   Informed Consent: I have reviewed the patients History and Physical, chart, labs and discussed the procedure including the risks, benefits and alternatives for the proposed anesthesia with the patient or authorized representative who has indicated his/her understanding and acceptance.     Plan Discussed with: Anesthesiologist  Anesthesia Plan Comments:         Anesthesia Quick Evaluation

## 2014-12-30 NOTE — H&P (Signed)
OBSTETRIC ADMISSION HISTORY AND PHYSICAL  Kristen Howard is a 34 y.o. female G3P1011 with IUP at 6160w1d by L/9 presenting for active labor. Contracts started ~7 am and increased in intensity and frequency. She reports +FMs, No LOF, no VB, no blurry vision, headaches or peripheral edema, and RUQ pain.  She plans on breast feeding- if her medications will allow). She request IUD for birth control.  Dating: By L/9 --->  Estimated Date of Delivery: 12/29/14  Prenatal History/Complications: [facility]atal Labs  Dating  L/9 Blood type: O/Positive/-- (05/18 0000)   Genetic Screen    Quad:negative     NIPS: Antibody:Negative (05/18 0000)  Anatomic US Normal-but limited views of lips (f/u 4-6 wks) Rubella: Immune (05/18 0000)  GTT Early:               Third trimester: 111 RPR: Nonreactive (05/18 0000)   Flu vaccine 10/14/14 HBsAg: Negative (05/18 0000)   TDaP vaccine  10/14/14                                             Rhogam: n/a HIV: Non-reactive (05/18 0000)   Baby Food Bottle ( on medications not recommended during breastfeeding for bipolar)                                    GBS: NEG  Contraception Mirena IUD Pap: wnl 06/2014  Circumcision  desired outpatient   Pediatrician Dr Donnie Coffinubin   Support Person  Misty StanleyLisa (mother)    Past Medical History: Past Medical History  Diagnosis Date  . Manic depression (HCC)   . PTSD (post-traumatic stress disorder)     Past Surgical History: Past Surgical History  Procedure Laterality Date  . Breast surgery  1998     Lump removed about age 89  . Hand surgery      Obstetrical History: OB History    Gravida Para Term Preterm AB TAB SAB Ectopic Multiple Living   3 1 1  0 1 1    1       Social History: Social History   Social History  . Marital Status: Single    Spouse Name: N/A  . Number of Children: N/A  . Years of Education: N/A   Social History Main Topics  . Smoking status: Former Smoker    Quit date: 07/22/2014  . Smokeless tobacco: Never  Used  . Alcohol Use: No  . Drug Use: No  . Sexual Activity: Not Currently    Birth Control/ Protection: None   Other Topics Concern  . None   Social History Narrative    Family History: Family History  Problem Relation Age of Onset  . Depression Mother   . Anxiety disorder Mother   . Alcoholism Mother   . Diabetes Mother   . Hypertension Mother   . Drug abuse Mother   . Lung cancer Father     Allergies: No Known Allergies  Prescriptions prior to admission  Medication Sig Dispense Refill Last Dose  . acetaminophen (TYLENOL) 500 MG tablet Take 2 tablets (1,000 mg total) by mouth every 8 (eight) hours as needed for mild pain, moderate pain, fever or headache. 90 tablet 0 12/29/2014  . cloNIDine (CATAPRES) 0.1 MG tablet Take 0.2 mg by mouth 2 (two) times daily. Takes 2  tablets in am.and at night.   12/29/2014  . desvenlafaxine (PRISTIQ) 100 MG 24 hr tablet Take 100 mg by mouth daily.   12/29/2014  . gabapentin (NEURONTIN) 400 MG capsule Take 400 mg by mouth 3 (three) times daily.   12/29/2014  . pantoprazole (PROTONIX) 40 MG tablet Take 1 tablet (40 mg total) by mouth daily. 30 tablet 4 12/29/2014  . Prenatal Multivit-Min-Fe-FA (PRENATAL VITAMINS) 0.8 MG tablet Take 1 tablet by mouth daily. 30 tablet 12 12/29/2014  . QUEtiapine (SEROQUEL) 200 MG tablet Take 600 mg by mouth at bedtime.   12/29/2014  . zolpidem (AMBIEN) 5 MG tablet Take 1 tablet (5 mg total) by mouth at bedtime as needed for sleep. 20 tablet 0 12/29/2014     Review of Systems   All systems reviewed and negative except as stated in HPI  Blood pressure 144/88, pulse 122, temperature 98.1 F (36.7 C), temperature source Oral, resp. rate 17, last menstrual period 03/23/2014. General appearance: alert, cooperative and appears stated age Lungs: clear to auscultation bilaterally Heart: regular rate and rhythm Abdomen: soft, non-tender; bowel sounds normal Pelvic: adequate Extremities: Homans sign is negative, no sign of  DVT  Presentation: cephalic Fetal monitoringBaseline: 130 bpm, Variability: Good {> 6 bpm) and Accelerations: Reactive Uterine activityDate/time of onset: 12/30/2014 at ~0700  Dilation: 6 Effacement (%): 80 Station: -2 Exam by:: Kayren Eaves RN    Prenatal labs: ABO, Rh: O/Positive/-- (05/18 0000) Antibody: Negative (05/18 0000) Rubella: Immune RPR: NON REAC (08/24 1427)  HBsAg: Negative (05/18 0000)  HIV: NONREACTIVE (08/24 1427)  GBS: Negative (10/12 0000)  1 hr Glucola 11 Genetic screening  nml Anatomy US wnl  Prenatal Transfer Tool  Maternal Diabetes: No Genetic Screening: Normal Maternal Ultrasounds/Referrals: Normal Fetal Ultrasounds or other Referrals:  None Maternal Substance Abuse:  No Significant Maternal Medications:  Meds include: Other:  Desvenlafaxine, gabapentin, seroquel, clonidine Significant Maternal Lab Results: Lab values include: Group B Strep negative  Results for orders placed or performed in visit on 12/30/14 (from the past 24 hour(s))  POCT urinalysis dip (device)   Collection Time: 12/30/14 10:23 AM  Result Value Ref Range   Glucose, UA NEGATIVE NEGATIVE mg/dL   Bilirubin Urine NEGATIVE NEGATIVE   Ketones, ur TRACE (A) NEGATIVE mg/dL   Specific Gravity, Urine 1.025 1.005 - 1.030   Hgb urine dipstick TRACE (A) NEGATIVE   pH 7.0 5.0 - 8.0   Protein, ur 30 (A) NEGATIVE mg/dL   Urobilinogen, UA 0.2 0.0 - 1.0 mg/dL   Nitrite NEGATIVE NEGATIVE   Leukocytes, UA TRACE (A) NEGATIVE    Patient Active Problem List   Diagnosis Date Noted  . Supervision of high risk pregnancy, antepartum 08/20/2014  . Bipolar disease during pregnancy in third trimester (HCC) 08/20/2014    Assessment: THETA LEAF is a 34 y.o. G3P1011 at [redacted]w[redacted]d here in active labor  #Labor:Expectant management. AROM if labor stalls since patient is multiparous #Pain: Epidural desired. PRN order placed #FWB: Cat I #ID:  GBS neg #MOF: Breast desired but she is unsure of safety  of medications. She has bipolar disease is well controlled and she does not want to disrupt her regimen #MOC:IUD planned, mirena #Circ:  Female, outpatient if desired.    Isa Rankin [facility] 12/30/2014, 3:56 PM

## 2014-12-30 NOTE — Progress Notes (Signed)
Subjective:  Kristen Howard is a 34 y.o. G3P1011 at 1160w1d being seen today for ongoing prenatal care.  Patient reports occasional contractions.  Contractions: Irregular.  Vag. Bleeding: Scant. Movement: Present. Denies leaking of fluid.   The following portions of the patient's history were reviewed and updated as appropriate: allergies, current medications, past family history, past medical history, past social history, past surgical history and problem list. Problem list updated.  Objective:   Filed Vitals:   12/30/14 1023  BP: 135/79  Pulse: 109  Weight: 227 lb (102.967 kg)    Fetal Status:     Movement: Present     General:  Alert, oriented and cooperative. Patient is in no acute distress.  Skin: Skin is warm and dry. No rash noted.   Cardiovascular: Normal heart rate noted  Respiratory: Normal respiratory effort, no problems with respiration noted  Abdomen: Soft, gravid, appropriate for gestational age. Pain/Pressure: Present     Pelvic: Vag. Bleeding: Scant Vag D/C Character: Mucous   Cervical exam performed Dilation: 3.5 Effacement (%): 50 Station: -3  Extremities: Normal range of motion.  Edema: Trace  Mental Status: Normal mood and affect. Normal behavior. Normal judgment and thought content.   Urinalysis: Urine Protein: 1+ Urine Glucose: Negative  Assessment and Plan:  Pregnancy: G3P1011 at 4560w1d  1. Supervision of high risk pregnancy, antepartum, third trimester   2. Post-term pregnancy, 40-42 weeks of gestation - Fetal nonstress test Reactive  Term labor symptoms and general obstetric precautions including but not limited to vaginal bleeding, contractions, leaking of fluid and fetal movement were reviewed in detail with the patient. Please refer to After Visit Summary for other counseling recommendations.  Return in about 2 days (around 01/01/2015) for as scheduled.   Rhea PinkLori A Clemmons, CNM

## 2014-12-30 NOTE — Patient Instructions (Addendum)
You are in Early Labor Keep monitoring your contractions and if they increase to every 5 minutes [facility]. If you experience your water breaking (a gush of fluid from the vagina) or have increased bleeding from the vagina go to the MAU as well. Thank you for coming in and congratulations!

## 2014-12-31 ENCOUNTER — Encounter (HOSPITAL_COMMUNITY): Payer: Self-pay | Admitting: Emergency Medicine

## 2014-12-31 DIAGNOSIS — Z3A40 40 weeks gestation of pregnancy: Secondary | ICD-10-CM

## 2014-12-31 LAB — RPR: RPR: NONREACTIVE

## 2014-12-31 MED ORDER — OXYCODONE-ACETAMINOPHEN 5-325 MG PO TABS
1.0000 | ORAL_TABLET | ORAL | Status: DC | PRN
  Administered 2015-01-01 (×2): 1 via ORAL
  Filled 2014-12-31 (×2): qty 1

## 2014-12-31 MED ORDER — CLONIDINE HCL 0.2 MG PO TABS
0.2000 mg | ORAL_TABLET | Freq: Two times a day (BID) | ORAL | Status: DC
  Administered 2014-12-31 – 2015-01-01 (×3): 0.2 mg via ORAL
  Filled 2014-12-31 (×5): qty 1

## 2014-12-31 MED ORDER — QUETIAPINE FUMARATE ER 200 MG PO TB24
600.0000 mg | ORAL_TABLET | Freq: Every day | ORAL | Status: DC
  Administered 2014-12-31: 600 mg via ORAL
  Filled 2014-12-31 (×2): qty 3

## 2014-12-31 MED ORDER — BENZOCAINE-MENTHOL 20-0.5 % EX AERO: 1.0000 "application " | INHALATION_SPRAY | CUTANEOUS | Status: DC | PRN

## 2014-12-31 MED ORDER — SIMETHICONE 80 MG PO CHEW: 80.0000 mg | CHEWABLE_TABLET | ORAL | Status: DC | PRN

## 2014-12-31 MED ORDER — ZOLPIDEM TARTRATE 5 MG PO TABS: 5.0000 mg | ORAL_TABLET | Freq: Every evening | ORAL | Status: DC | PRN

## 2014-12-31 MED ORDER — DIBUCAINE 1 % RE OINT: 1.0000 "application " | TOPICAL_OINTMENT | RECTAL | Status: DC | PRN

## 2014-12-31 MED ORDER — SENNOSIDES-DOCUSATE SODIUM 8.6-50 MG PO TABS
2.0000 | ORAL_TABLET | ORAL | Status: DC
  Administered 2014-12-31: 2 via ORAL
  Filled 2014-12-31: qty 2

## 2014-12-31 MED ORDER — OXYCODONE-ACETAMINOPHEN 5-325 MG PO TABS
2.0000 | ORAL_TABLET | ORAL | Status: DC | PRN
  Administered 2014-12-31 – 2015-01-01 (×3): 2 via ORAL
  Filled 2014-12-31 (×3): qty 2

## 2014-12-31 MED ORDER — ONDANSETRON HCL 4 MG/2ML IJ SOLN: 4.0000 mg | INTRAMUSCULAR | Status: DC | PRN

## 2014-12-31 MED ORDER — LANOLIN HYDROUS EX OINT: TOPICAL_OINTMENT | CUTANEOUS | Status: DC | PRN

## 2014-12-31 MED ORDER — INFLUENZA VAC SPLIT QUAD 0.5 ML IM SUSY: 0.5000 mL | PREFILLED_SYRINGE | INTRAMUSCULAR | Status: DC

## 2014-12-31 MED ORDER — DIPHENHYDRAMINE HCL 25 MG PO CAPS: 25.0000 mg | ORAL_CAPSULE | Freq: Four times a day (QID) | ORAL | Status: DC | PRN

## 2014-12-31 MED ORDER — PNEUMOCOCCAL VAC POLYVALENT 25 MCG/0.5ML IJ INJ
0.5000 mL | INJECTION | INTRAMUSCULAR | Status: DC
  Filled 2014-12-31: qty 0.5

## 2014-12-31 MED ORDER — WITCH HAZEL-GLYCERIN EX PADS: 1.0000 "application " | MEDICATED_PAD | CUTANEOUS | Status: DC | PRN

## 2014-12-31 MED ORDER — GABAPENTIN 400 MG PO CAPS
400.0000 mg | ORAL_CAPSULE | Freq: Three times a day (TID) | ORAL | Status: DC
  Administered 2014-12-31 – 2015-01-01 (×3): 400 mg via ORAL
  Filled 2014-12-31 (×6): qty 1

## 2014-12-31 MED ORDER — PRENATAL MULTIVITAMIN CH
1.0000 | ORAL_TABLET | Freq: Every day | ORAL | Status: DC
  Administered 2014-12-31: 1 via ORAL

## 2014-12-31 MED ORDER — ACETAMINOPHEN 325 MG PO TABS: 650.0000 mg | ORAL_TABLET | ORAL | Status: DC | PRN

## 2014-12-31 MED ORDER — VENLAFAXINE HCL ER 150 MG PO CP24
150.0000 mg | ORAL_CAPSULE | Freq: Every day | ORAL | Status: DC
  Administered 2014-12-31 – 2015-01-01 (×2): 150 mg via ORAL
  Filled 2014-12-31 (×3): qty 1

## 2014-12-31 MED ORDER — IBUPROFEN 600 MG PO TABS
600.0000 mg | ORAL_TABLET | Freq: Four times a day (QID) | ORAL | Status: DC
  Administered 2014-12-31 – 2015-01-01 (×6): 600 mg via ORAL
  Filled 2014-12-31 (×6): qty 1

## 2014-12-31 MED ORDER — TETANUS-DIPHTH-ACELL PERTUSSIS 5-2.5-18.5 LF-MCG/0.5 IM SUSP: 0.5000 mL | Freq: Once | INTRAMUSCULAR | Status: DC

## 2014-12-31 MED ORDER — ONDANSETRON HCL 4 MG PO TABS: 4.0000 mg | ORAL_TABLET | ORAL | Status: DC | PRN

## 2014-12-31 NOTE — Plan of Care (Signed)
Called pharmacy to review safety of venlafaxine and quetiapine with breastfeeding. Patient originally planned to bottle feed but is now hoping to breast feed. Pharmacist relayed that effexor is safe with breastfeeding but seroquel has conflicting recommendations. Seroquel is not recommended while breastfeeding by product manufacturer. However, limited studies have shown minimal secretion of seroquel in breast milk with no adverse outcomes on infants. A theoretical risk of increased drowsiness and not meeting developmental milestones exists.   Reviewed this information with patient. She states she has been on seroquel for the past 4-5 years and that it is the only medication that keeps her feeling "even." She wants to continue taking seroquel.   Patient uneasy about theoretical risk of delayed milestones on seroquel. She would like to think about this information more before making a decision about her comfort level about breastfeeding. Advised patient to think about how important breastfeeding is to her and to consider lack of evidence of adverse effects of seroquel vs. theoretical risk.

## 2014-12-31 NOTE — Progress Notes (Signed)
Bladder scanned Pt. And got 593 (possibly did not pick up the entire amount?); Fundus to the right and pt. Complaining of pain; voided and rescanned.  Got 578; was present for void and patient voided a heavy amount.  After rechecking fundus, it was more midline and firmer.  Patient stated she felt much better.  Encouraged pt. To void again soon and hat was placed to measure output.  Will continue to monitor.

## 2014-12-31 NOTE — Progress Notes (Signed)
Post Partum Day 1 Subjective:  Kristen Howard is a 34 y.o. G3P2011 7116w1d s/p SVD.  No acute events overnight after delivery.  Pt denies problems with ambulating or voiding. She denies nausea, vomiting, dizziness, blurry vision, chest pain, trouble breathing, or abdominal pain.  Pain is moderately controlled with Ibuprofen so she is going to try something stronger.  She has had flatus.  Lochia is small.  Plan for birth control is IUD.  Method of feeding is breast and bottle.  Objective: Blood pressure 140/63, pulse 120, temperature 98.8 F (37.1 C), temperature source Oral, resp. rate 18, height 5\' 7"  (1.702 m), weight 102.967 kg (227 lb), last menstrual period 03/23/2014, SpO2 98 %, unknown if currently breastfeeding.  Physical Exam:  General: alert, cooperative and no distress Lochia: normal flow Chest: normal WOB, clear to auscultation on frontal lung fields Heart: Regular rate and rhythm, S1S2 heard Abdomen: +BS, appropriately tender Uterine Fundus: firm, located on patient's right at the level of the umbilicus Extremities: no lower extremity edema   Recent Labs  12/30/14 1615  HGB 11.1*  HCT 33.2*    Assessment/Plan:  ASSESSMENT: Kristen Howard is a 34 y.o. [id number] 4516w1d s/p SVD of baby boy. She has a history of bipolar disorder well-controlled on medications. She also has a history of post-partum depression.  - Breastfeeding: Patient is worried about medication interactions with breast milk. Reassured her that her medications were safe for baby and will restart today. Encouraged patient to make an appointment with outpatient psych provider to review medications postpartum. - Social Work consult for h/o bipolar disorder and post-partum depression - Patient denies trouble voiding but does feel like she is retaining urine. Will bladder scan q6hrs and I&O cath if retaining (>250cc).  Plan for discharge tomorrow Contraception is IUD. Continue routine PP care  Breastfeeding  support PRN  LOS: 1 day   Kristen RungKristen Howard 12/31/2014, 7:59 AM    CNM attestation Post Partum Day #1  Kristen Howard is a 34 y.o. G3P2011 s/p SVD.  Pt denies problems with ambulating or po intake. Pain is well controlled.  Plan for birth control is IUD.  Method of Feeding: breast/bottle  PE:  BP 140/63 mmHg  Pulse 120  Temp(Src) 98.8 F (37.1 C) (Oral)  Resp 18  Ht 5\' 7"  (1.702 m)  Wt 102.967 kg (227 lb)  BMI 35.55 kg/m2  SpO2 98%  LMP 03/23/2014 (Within Weeks)  Breastfeeding? Unknown Fundus firm  Plan for discharge: 01/01/15  Psych meds restarted Voiding concern passed on to oncoming shift  Kristen Howard, Kristen Howard, CNM 9:07 AM  12/31/2014

## 2014-12-31 NOTE — Anesthesia Postprocedure Evaluation (Signed)
  Anesthesia Post-op Note  Patient: Antony SalmonMaria A Paradis  Procedure(s) Performed: * No procedures listed *  Patient Location: PACU and Mother/Baby  Anesthesia Type:Epidural  Level of Consciousness: awake, alert  and oriented  Airway and Oxygen Therapy: Patient Spontanous Breathing  Post-op Pain: mild  Post-op Assessment: Post-op Vital signs reviewed              Post-op Vital Signs: Reviewed and stable  Last Vitals:  Filed Vitals:   12/31/14 1800  BP: 135/78  Pulse: 89  Temp: 36.7 C  Resp: 18    Complications: No apparent anesthesia complications

## 2014-12-31 NOTE — Lactation Note (Signed)
This note was copied from the chart of [name]. Lactation Consultation Note  Patient Name: [name] AOZHY'QToday's Date: 12/31/2014  Per RN, Mom has decided to bottle feed.    Maternal Data    Feeding Feeding Type: Bottle Fed - Formula Nipple Type: Slow - flow  LATCH Score/Interventions                      Lactation Tools Discussed/Used     Consult Status      Kristen Howard, Kristen Howard 12/31/2014, 9:06 PM

## 2014-12-31 NOTE — Addendum Note (Signed)
Addendum  created 12/31/14 2056 by Armanda Heritageharlesetta M Kion Huntsberry, CRNA   Modules edited: Notes Section   Notes Section:  File: [id number]

## 2014-12-31 NOTE — Progress Notes (Signed)
Pt. Voided into hat; measured 100 cc but pt. Reported most of urine went into toilet.  Rescanned bladder and got 14 ml ; fundus midline and 1 below now. Pain is 0.

## 2014-12-31 NOTE — Anesthesia Postprocedure Evaluation (Signed)
Anesthesia Post Note  Patient: Kristen SalmonMaria A Howard  Procedure(s) Performed: * No procedures listed *  Anesthesia type: Epidural  Patient location: Mother/Baby  Post pain: Pain level controlled  Post assessment: Post-op Vital signs reviewed  Last Vitals:  Filed Vitals:   12/31/14 0230  BP: 140/63  Pulse: 120  Temp: 37.1 C  Resp: 18    Post vital signs: Reviewed  Level of consciousness:alert  Complications: No apparent anesthesia complications
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
## 2015-01-01 ENCOUNTER — Other Ambulatory Visit: Payer: Medicaid Other

## 2015-01-01 MED ORDER — SENNOSIDES-DOCUSATE SODIUM 8.6-50 MG PO TABS: 2.0000 | ORAL_TABLET | ORAL | Status: AC

## 2015-01-01 MED ORDER — VENLAFAXINE HCL ER 150 MG PO CP24: 150.0000 mg | ORAL_CAPSULE | Freq: Every day | ORAL | Status: AC

## 2015-01-01 MED ORDER — IBUPROFEN 600 MG PO TABS: 600.0000 mg | ORAL_TABLET | Freq: Four times a day (QID) | ORAL | Status: AC

## 2015-01-01 MED ORDER — OXYCODONE-ACETAMINOPHEN 5-325 MG PO TABS: 1.0000 | ORAL_TABLET | ORAL | Status: AC | PRN

## 2015-01-01 MED ORDER — VENLAFAXINE HCL ER 150 MG PO CP24: 150.0000 mg | ORAL_CAPSULE | Freq: Every day | ORAL | Status: DC

## 2015-01-01 MED ORDER — OXYCODONE-ACETAMINOPHEN 5-325 MG PO TABS: 1.0000 | ORAL_TABLET | ORAL | Status: DC | PRN

## 2015-01-01 NOTE — Discharge Instructions (Signed)
Breastfeeding Deciding to breastfeed is one of the best choices you can make for you and your baby. A change in hormones during pregnancy causes your breast tissue to grow and increases the number and size of your milk ducts. These hormones also allow proteins, sugars, and fats from your blood supply to make breast milk in your milk-producing glands. Hormones prevent breast milk from being released before your baby is born as well as prompt milk flow after birth. Once breastfeeding has begun, thoughts of your baby, as well as his or her sucking or crying, can stimulate the release of milk from your milk-producing glands.  BENEFITS OF BREASTFEEDING For Your Baby 1. Your first milk (colostrum) helps your baby's digestive system function better. 2. There are antibodies in your milk that help your baby fight off infections. 3. Your baby has a lower incidence of asthma, allergies, and sudden infant death syndrome. 4. The nutrients in breast milk are better for your baby than infant formulas and are designed uniquely for your baby's needs. 5. Breast milk improves your baby's brain development. 6. Your baby is less likely to develop other conditions, such as childhood obesity, asthma, or type 2 diabetes mellitus. For You  Breastfeeding helps to create a very special bond between you and your baby.  Breastfeeding is convenient. Breast milk is always available at the correct temperature and costs nothing.  Breastfeeding helps to burn calories and helps you lose the weight gained during pregnancy.  Breastfeeding makes your uterus contract to its prepregnancy size faster and slows bleeding (lochia) after you give birth.   Breastfeeding helps to lower your risk of developing type 2 diabetes mellitus, osteoporosis, and breast or ovarian cancer later in life. SIGNS THAT YOUR BABY IS HUNGRY Early Signs of Hunger  Increased alertness or activity.  Stretching.  Movement of the head from side to  side.  Movement of the head and opening of the mouth when the corner of the mouth or cheek is stroked (rooting).  Increased sucking sounds, smacking lips, cooing, sighing, or squeaking.  Hand-to-mouth movements.  Increased sucking of fingers or hands. Late Signs of Hunger  Fussing.  Intermittent crying. Extreme Signs of Hunger Signs of extreme hunger will require calming and consoling before your baby will be able to breastfeed successfully. Do not wait for the following signs of extreme hunger to occur before you initiate breastfeeding:  Restlessness.  A loud, strong cry.  Screaming. BREASTFEEDING BASICS Breastfeeding Initiation  Find a comfortable place to sit or lie down, with your neck and back well supported.  Place a pillow or rolled up blanket under your baby to bring him or her to the level of your breast (if you are seated). Nursing pillows are specially designed to help support your arms and your baby while you breastfeed.  Make sure that your baby's abdomen is facing your abdomen.  Gently massage your breast. With your fingertips, massage from your chest wall toward your nipple in a circular motion. This encourages milk flow. You may need to continue this action during the feeding if your milk flows slowly.  Support your breast with 4 fingers underneath and your thumb above your nipple. Make sure your fingers are well away from your nipple and your baby's mouth.  Stroke your baby's lips gently with your finger or nipple.  When your baby's mouth is open wide enough, quickly bring your baby to your breast, placing your entire nipple and as much of the colored area around your nipple (  areola) as possible into your baby's mouth.  More areola should be visible above your baby's upper lip than below the lower lip.  Your baby's tongue should be between his or her lower gum and your breast.  Ensure that your baby's mouth is correctly positioned around your nipple  (latched). Your baby's lips should create a seal on your breast and be turned out (everted).  It is common for your baby to suck about 2-3 minutes in order to start the flow of breast milk. Latching Teaching your baby how to latch on to your breast properly is very important. An improper latch can cause nipple pain and decreased milk supply for you and poor weight gain in your baby. Also, if your baby is not latched onto your nipple properly, he or she may swallow some air during feeding. This can make your baby fussy. Burping your baby when you switch breasts during the feeding can help to get rid of the air. However, teaching your baby to latch on properly is still the best way to prevent fussiness from swallowing air while breastfeeding. Signs that your baby has successfully latched on to your nipple:  Silent tugging or silent sucking, without causing you pain.  Swallowing heard between every 3-4 sucks.  Muscle movement above and in front of his or her ears while sucking. Signs that your baby has not successfully latched on to nipple:  Sucking sounds or smacking sounds from your baby while breastfeeding.  Nipple pain. If you think your baby has not latched on correctly, slip your finger into the corner of your baby's mouth to break the suction and place it between your baby's gums. Attempt breastfeeding initiation again. Signs of Successful Breastfeeding Signs from your baby:  A gradual decrease in the number of sucks or complete cessation of sucking.  Falling asleep.  Relaxation of his or her body.  Retention of a small amount of milk in his or her mouth.  Letting go of your breast by himself or herself. Signs from you:  Breasts that have increased in firmness, weight, and size 1-3 hours after feeding.  Breasts that are softer immediately after breastfeeding.  Increased milk volume, as well as a change in milk consistency and color by the fifth day of breastfeeding.  Nipples  that are not sore, cracked, or bleeding. Signs That Your [employer] is Getting Enough Milk  Wetting at least 3 diapers in a 24-hour period. The urine should be clear and pale yellow by age 451 days.  At least 3 stools in a 24-hour period by age 451 days. The stool should be soft and yellow.  At least 3 stools in a 24-hour period by age 30 days. The stool should be seedy and yellow.  No loss of weight greater than 10% of birth weight during the first 49 days of age.  Average weight gain of 4-7 ounces (113-198 g) per week after age 45 days.  Consistent daily weight gain by age 453 days, without weight loss after the age of 2 weeks. After a feeding, your baby may spit up a small amount. This is common. BREASTFEEDING FREQUENCY AND DURATION Frequent feeding will help you make more milk and can prevent sore nipples and breast engorgement. Breastfeed when you feel the need to reduce the fullness of your breasts or when your baby shows signs of hunger. This is called "breastfeeding on demand." Avoid introducing a pacifier to your baby while you are working to establish breastfeeding (the first 4-6 weeks  after your baby is born). After this time you may choose to use a pacifier. Research has shown that pacifier use during the first year of a baby's life decreases the risk of sudden infant death syndrome (SIDS). °Allow your baby to feed on each breast as long as he or she wants. Breastfeed until your baby is finished feeding. When your baby unlatches or falls asleep while feeding from the first breast, offer the second breast. Because newborns are often sleepy in the first few weeks of life, you may need to awaken your baby to get him or her to feed. °Breastfeeding times will vary from baby to baby. However, the following rules can serve as a guide to help you ensure that your baby is properly fed: °· Newborns (babies 4 weeks of age or younger) may breastfeed every 1-3 hours. °· Newborns should not go longer than 3 hours  during the day or 5 hours during the night without breastfeeding. °· You should breastfeed your baby a minimum of 8 times in a 24-hour period until you begin to introduce solid foods to your baby at around 6 months of age. °BREAST MILK PUMPING °Pumping and storing breast milk allows you to ensure that your baby is exclusively fed your breast milk, even at times when you are unable to breastfeed. This is especially important if you are going back to work while you are still breastfeeding or when you are not able to be present during feedings. Your lactation consultant can give you guidelines on how long it is safe to store breast milk. °A breast pump is a machine that allows you to pump milk from your breast into a sterile bottle. The pumped breast milk can then be stored in a refrigerator or freezer. Some breast pumps are operated by hand, while others use electricity. Ask your lactation consultant which type will work best for you. Breast pumps can be purchased, but some hospitals and breastfeeding support groups lease breast pumps on a monthly basis. A lactation consultant can teach you how to hand express breast milk, if you prefer not to use a pump. °CARING FOR YOUR BREASTS WHILE YOU BREASTFEED °Nipples can become dry, cracked, and sore while breastfeeding. The following recommendations can help keep your breasts moisturized and healthy: °· Avoid using soap on your nipples. °· Wear a supportive bra. Although not required, special nursing bras and tank tops are designed to allow access to your breasts for breastfeeding without taking off your entire bra or top. Avoid wearing underwire-style bras or extremely tight bras. °· Air dry your nipples for 3-4 minutes after each feeding. °· Use only cotton bra pads to absorb leaked breast milk. Leaking of breast milk between feedings is normal. °· Use lanolin on your nipples after breastfeeding. Lanolin helps to maintain your skin's normal moisture barrier. If you use  pure lanolin, you do not need to wash it off before feeding your baby again. Pure lanolin is not toxic to your baby. You may also hand express a few drops of breast milk and gently massage that milk into your nipples and allow the milk to air dry. °In the first few weeks after giving birth, some women experience extremely full breasts (engorgement). Engorgement can make your breasts feel heavy, warm, and tender to the touch. Engorgement peaks within 3-5 days after you give birth. The following recommendations can help ease engorgement: °· Completely empty your breasts while breastfeeding or pumping. You may want to start by applying warm, moist heat (in   the shower or with warm water-soaked hand towels) just before feeding or pumping. This increases circulation and helps the milk flow. If your baby does not completely empty your breasts while breastfeeding, pump any extra milk after he or she is finished.  Wear a snug bra (nursing or regular) or tank top for 1-2 days to signal your body to slightly decrease milk production.  Apply ice packs to your breasts, unless this is too uncomfortable for you.  Make sure that your baby is latched on and positioned properly while breastfeeding. If engorgement persists after 48 hours of following these recommendations, contact your health care provider or a Advertising copywriter. OVERALL HEALTH CARE RECOMMENDATIONS WHILE BREASTFEEDING  Eat healthy foods. Alternate between meals and snacks, eating 3 of each per day. Because what you eat affects your breast milk, some of the foods may make your baby more irritable than usual. Avoid eating these foods if you are sure that they are negatively affecting your baby.  Drink milk, fruit juice, and water to satisfy your thirst (about 10 glasses a day).  Rest often, relax, and continue to take your prenatal vitamins to prevent fatigue, stress, and anemia.  Continue breast self-awareness checks.  Avoid chewing and smoking  tobacco. Chemicals from cigarettes that pass into breast milk and exposure to secondhand smoke may harm your baby.  Avoid alcohol and drug use, including marijuana. Some medicines that may be harmful to your baby can pass through breast milk. It is important to ask your health care provider before taking any medicine, including all over-the-counter and prescription medicine as well as vitamin and herbal supplements. It is possible to become pregnant while breastfeeding. If birth control is desired, ask your health care provider about options that will be safe for your baby. SEEK MEDICAL CARE IF:  You feel like you want to stop breastfeeding or have become frustrated with breastfeeding.  You have painful breasts or nipples.  Your nipples are cracked or bleeding.  Your breasts are red, tender, or warm.  You have a swollen area on either breast.  You have a fever or chills.  You have nausea or vomiting.  You have drainage other than breast milk from your nipples.  Your breasts do not become full before feedings by the fifth day after you give birth.  You feel sad and depressed.  Your baby is too sleepy to eat well.  Your baby is having trouble sleeping.   Your baby is wetting less than 3 diapers in a 24-hour period.  Your baby has less than 3 stools in a 24-hour period.  Your baby's skin or the white part of his or her eyes becomes yellow.   Your baby is not gaining weight by 7 days of age. SEEK IMMEDIATE MEDICAL CARE IF:  Your baby is overly tired (lethargic) and does not want to wake up and feed.  Your baby develops an unexplained fever.   This information is not intended to replace advice given to you by your health care provider. Make sure you discuss any questions you have with your health care provider.   Document Released: 02/06/2005 Document Revised: 10/28/2014 Document Reviewed: 07/31/2012 Elsevier Interactive Patient Education 2016 [employer].  Breastfeeding Challenges and Solutions Even though breastfeeding is natural, it can be challenging, especially in the first few weeks after childbirth. It is normal for problems to arise when starting to breastfeed your new baby, even if you have breastfed before. This document provides some solutions to the most  common breastfeeding challenges.  CHALLENGES AND SOLUTIONS Challenge--Cracked or Sore Nipples Cracked or sore nipples are commonly experienced by breastfeeding mothers. Cracked or sore nipples often are caused by inadequate latching (when your baby's mouth attaches to your breast to breastfeed). Soreness can also happen if your baby is not positioned properly at your breast. Although nipple cracking and soreness are common during the first week after birth, nipple pain is never normal. If you experience nipple cracking or soreness that lasts longer than 1 week or nipple pain, call your health care provider or lactation consultant.  Solution Ensure proper latching and positioning of your baby by following the steps below: 7. Find a comfortable place to sit or lie down, with your neck and back well supported. 8. Place a pillow or rolled up blanket under your baby to bring him or her to the level of your breast (if you are seated). 9. Make sure that your baby's abdomen is facing your abdomen. 10. Gently massage your breast. With your fingertips, massage from your chest wall toward your nipple in a circular motion. This encourages milk flow. You may need to continue this action during the feeding if your milk flows slowly. 11. Support your breast with 4 fingers underneath and your thumb above your nipple. Make sure your fingers are well away from your nipple and your baby's mouth. 12. Stroke your baby's lips gently with your finger or nipple. 13. When your baby's mouth is open wide enough, quickly bring your baby to your breast, placing your entire nipple and as much of the colored area  around your nipple (areola) as possible into your baby's mouth. 1. More areola should be visible above your baby's upper lip than below the lower lip. 2. Your baby's tongue should be between his or her lower gum and your breast. 14. Ensure that your baby's mouth is correctly positioned around your nipple (latched). Your baby's lips should create a seal on your breast and be turned out (everted). 15. It is common for your baby to suck for about 2-3 minutes in order to start the flow of breast milk. Signs that your baby has successfully latched on to your nipple include:   Quietly tugging or quietly sucking without causing you pain.   Swallowing heard between every 3-4 sucks.   Muscle movement above and in front of his or her ears with sucking.  Signs that your baby has not successfully latched on to nipple include:   Sucking sounds or smacking sounds from your baby while nursing.   Nipple pain.  Ensure that your breasts stay moisturized and healthy by:  Avoiding the use of soap on your nipples.   Wearing a supportive bra. Avoid wearing underwire-style bras or tight bras.   Air drying your nipples for 3-4 minutes after each feeding.   Using only cotton bra pads to absorb breast milk leakage. Leaking of breast milk between feedings is normal. Be sure to change the pads if they become soaked with milk.  Using lanolin on your nipples after nursing. Lanolin helps to maintain your skin's normal moisture barrier. If you use pure lanolin you do not need to wash it off before feeding your baby again. Pure lanolin is not toxic to your baby. You may also hand express a few drops of breast milk and gently massage that milk into your nipples, allowing it to air dry. Challenge--Breast Engorgement Breast engorgement is the overfilling of your breasts with breast milk. In the first few weeks after  giving birth, you may experience breast engorgement. Breast engorgement can make your breasts  throb and feel hard, tightly stretched, warm, and tender. Engorgement peaks about the fifth day after you give birth. Having breast engorgement does not mean you have to stop breastfeeding your baby. Solution  Breastfeed when you feel the need to reduce the fullness of your breasts or when your baby shows signs of hunger. This is called "breastfeeding on demand."  Newborns (babies younger than 4 weeks) often breastfeed every 1-3 hours during the day. You may need to awaken your baby to feed if he or she is asleep at a feeding time.  Do not allow your baby to sleep longer than 5 hours during the night without a feeding.  Pump or hand express breast milk before breastfeeding to soften your breast, areola, and nipple.  Apply warm, moist heat (in the shower or with warm water-soaked hand towels) just before feeding or pumping, or massage your breast before or during breastfeeding. This increases circulation and helps your milk to flow.  Completely empty your breasts when breastfeeding or pumping. Afterward, wear a snug bra (nursing or regular) or tank top for 1-2 days to signal your body to slightly decrease milk production. Only wear snug bras or tank tops to treat engorgement. Tight bras typically should be avoided by breastfeeding mothers. Once engorgement is relieved, return to wearing regular, loose-fitting clothes.  Apply ice packs to your breasts to lessen the pain from engorgement and relieve swelling, unless the ice is uncomfortable for you.  Do not delay feedings. Try to relax when it is time to feed your baby. This helps to trigger your "let-down reflex," which releases milk from your breast.  Ensure your baby is latched on to your breast and positioned properly while breastfeeding.  Allow your baby to remain at your breast as long as he or she is latched on well and actively sucking. Your baby will let you know when he or she is done breastfeeding by pulling away from your breast or  falling asleep.  Avoid introducing bottles or pacifiers to your baby in the early weeks of breastfeeding. Wait to introduce these things until after resolving any breastfeeding challenges.  Try to pump your milk on the same schedule as when your baby would breastfeed if you are returning to work or away from home for an extended period.  Drink plenty of fluids to avoid dehydration, which can eventually put you at greater risk of breast engorgement. If you follow these suggestions, your engorgement should improve in 24-48 hours. If you are still experiencing difficulty, call your lactation consultant or health care provider.  Challenge--Plugged Milk Ducts Plugged milk ducts occur when the duct does not drain milk effectively and becomes swollen. Wearing a tight-fitting nursing bra or having difficulty with latching may cause plugged milk ducts. Not drinking enough water (8-10 c [1.9-2.4 L] per day) can contribute to plugged milk ducts. Once a duct has become plugged, hard lumps, soreness, and redness may develop in your breast.  Solution Do not delay feedings. Feed your baby frequently and try to empty your breasts of milk at each feeding. Try breastfeeding from the affected side first so there is a better chance that the milk will drain completely from that breast. Apply warm, moist towels to your breasts for 5-10 minutes before feeding. Alternatively, a hot shower right before breastfeeding can provide the moist heat that can encourage milk flow. Gentle massage of the sore area before and during  a feeding may also help. Avoid wearing tight clothing or bras that put pressure on your breasts. Wear bras that offer good support to your breasts, but avoid underwire bras. If you have a plugged milk duct and develop a fever, you need to see your health care provider.  Challenge--Mastitis Mastitis is inflammation of your breast. It usually is caused by a bacterial infection and can cause flu-like symptoms. You  may develop redness in your breast and a fever. Often when mastitis occurs, your breast becomes firm, warm, and very painful. The most common causes of mastitis are poor latching, ineffective sucking from your baby, consistent pressure on your breast (possibly from wearing a tight-fitting bra or shirt that restricts the milk flow), unusual stress or fatigue, or missed feedings.  Solution You will be given antibiotic medicine to treat the infection. It is still important to breastfeed frequently to empty your breasts. Continuing to breastfeed while you recover from mastitis will not harm your baby. Make sure your baby is positioned properly during every feeding. Apply moist heat to your breasts for a few minutes before feeding to help the milk flow and to help your breasts empty more easily. Challenge--Thrush Ginette Pitmanhrush is a yeast infection that can form on your nipples, in your breast, or in your baby's mouth. It causes itching, soreness, burning or stabbing pain, and sometimes a rash.  Solution You will be given a medicated ointment for your nipples, and your baby will be given a liquid medicine for his or her mouth. It is important that you and your baby are treated at the same time because thrush can be passed between you and your baby. Change disposable nursing pads often. Any bras, towels, or clothing that come in contact with infected areas of your body or your baby's body need to be washed in very hot water every day. Wash your hands and your baby's hands often. All pacifiers, bottle nipples, or toys your baby puts in his or her mouth should be boiled once a day for 20 minutes. After 1 week of treatment, discard pacifiers and bottle nipples and buy new ones. All breast pump parts that touch the milk need to be boiled for 20 minutes every day. Challenge--Low Milk Supply You may not be producing enough milk if your baby is not gaining the proper amount of weight. Breast milk production is based on a  supply-and-demand system. Your milk supply depends on how frequently and effectively your baby empties your breast. Solution The more you breastfeed and pump, the more breast milk you will produce. It is important that your baby empties at least one of your breasts at each feeding. If this is not happening, then use a breast pump or hand express any milk that remains. This will help to drain as much milk as possible at each feeding. It will also signal your body to produce more milk. If your baby is not emptying your breasts, it may be due to latching, sucking, or positioning problems. If low milk supply continues after addressing these issues, contact your health care provider or a lactation specialist as soon as possible. Challenge--Inverted or Flat Nipples Some women have nipples that turn inward instead of protruding outward. Other women have nipples that are flat. Inverted or flat nipples can sometimes make it more difficult for your baby to latch onto your breast. Solution You may be given a small device that pulls out inverted nipples. This device should be applied right before your baby is brought  to your breast. You can also try using a breast pump for a short time before placing the baby at your breast. The pump can pull your nipple outwards to help your infant latch more easily. The baby's sucking motion will help the inverted nipple protrude as well.  If you have flat nipples, encourage your baby to latch onto your breast and feed frequently in the early days after birth. This will give your baby practice latching on correctly while your breast is still soft. When your milk supply increases, between the second and fifth day after birth and your breasts become full, your baby will have an easier time latching.  Contact a lactation consultant if you still have concerns. She or he can teach you additional techniques to address breastfeeding problems related to nipple shape and position.  FOR MORE  INFORMATION [employer]: www.llli.org   This information is not intended to replace advice given to you by your health care provider. Make sure you discuss any questions you have with your health care provider.   Document Released: 07/31/2005 Document Revised: 02/27/2014 Document Reviewed: 08/02/2012 Elsevier Interactive Patient Education 2016 Elsevier Inc.  Postpartum Care After Vaginal Delivery After you deliver your newborn (postpartum period), the usual stay in the hospital is 24-72 hours. If there were problems with your labor or delivery, or if you have other medical problems, you might be in the hospital longer.  While you are in the hospital, you will receive help and instructions on how to care for yourself and your newborn during the postpartum period.  While you are in the hospital: 16. Be sure to tell your nurses if you have pain or discomfort, as well as where you feel the pain and what makes the pain worse. 17. If you had an incision made near your vagina (episiotomy) or if you had some tearing during delivery, the nurses may put ice packs on your episiotomy or tear. The ice packs may help to reduce the pain and swelling. 18. If you are breastfeeding, you may feel uncomfortable contractions of your uterus for a couple of weeks. This is normal. The contractions help your uterus get back to normal size. 19. It is normal to have some bleeding after delivery. 1. For the first 1-3 days after delivery, the flow is red and the amount may be similar to a period. 2. It is common for the flow to start and stop. 3. In the first few days, you may pass some small clots. Let your nurses know if you begin to pass large clots or your flow increases. 4. Do not  flush blood clots down the toilet before having the nurse look at them. 5. During the next 3-10 days after delivery, your flow should become more watery and pink or brown-tinged in color. 6. Ten to fourteen days after  delivery, your flow should be a small amount of yellowish-white discharge. 7. The amount of your flow will decrease over the first few weeks after delivery. Your flow may stop in 6-8 weeks. Most women have had their flow stop by 12 weeks after delivery. 20. You should change your sanitary pads frequently. 21. Wash your hands thoroughly with soap and water for at least 20 seconds after changing pads, using the toilet, or before holding or feeding your newborn. 22. You should feel like you need to empty your bladder within the first 6-8 hours after delivery. 23. In case you become weak, lightheaded, or faint, call your nurse before  you get out of bed for the first time and before you take a shower for the first time. 24. Within the first few days after delivery, your breasts may begin to feel tender and full. This is called engorgement. Breast tenderness usually goes away within 48-72 hours after engorgement occurs. You may also notice milk leaking from your breasts. If you are not breastfeeding, do not stimulate your breasts. Breast stimulation can make your breasts produce more milk. 25. Spending as much time as possible with your newborn is very important. During this time, you and your newborn can feel close and get to know each other. Having your newborn stay in your room (rooming in) will help to strengthen the bond with your newborn. It will give you time to get to know your newborn and become comfortable caring for your newborn. 26. Your hormones change after delivery. Sometimes the hormone changes can temporarily cause you to feel sad or tearful. These feelings should not last more than a few days. If these feelings last longer than that, you should talk to your caregiver. 27. If desired, talk to your caregiver about methods of family planning or contraception. 28. Talk to your caregiver about immunizations. Your caregiver may want you to have the following immunizations before leaving the  hospital: 1. Tetanus, diphtheria, and pertussis (Tdap) or tetanus and diphtheria (Td) immunization. It is very important that you and your family (including grandparents) or others caring for your newborn are up-to-date with the Tdap or Td immunizations. The Tdap or Td immunization can help protect your newborn from getting ill. 2. Rubella immunization. 3. Varicella (chickenpox) immunization. 4. Influenza immunization. You should receive this annual immunization if you did not receive the immunization during your pregnancy.   This information is not intended to replace advice given to you by your health care provider. Make sure you discuss any questions you have with your health care provider.   Document Released: 12/04/2006 Document Revised: 11/01/2011 Document Reviewed: 10/04/2011 Elsevier Interactive Patient Education 2016 Elsevier Inc.  Vaginal Laceration A vaginal laceration is a tear in the vaginal wall. A vaginal tear falls into one of three categories:  29. Obstetrically related tears that occur at the time of childbirth. 30. Trauma-related tears (most often related to sexual intercourse). 31. Spontaneous tears. Vaginal tears can cause heavy bleeding (hemorrhaging) depending on severity of the tear. Tears can be intensely tender and interfere with normal activities of living. They can make sexual intercourse painful and bring on significant burning with urination. If you have a vaginal laceration, the area around your vagina may be painful when you touch or wipe it. Even light pressure from clothing may cause some pain. Vaginal tear need to be evaluated by your caregiver.  CAUSES   Obstetric-related causes, such as childbirth.  Trauma that may result from an accident during an activity, such as sexual intercourse or a bicycle ride.  Spontaneous causes related to aging, failed healing of a past obstetric tear, chronic irritation, or skin changes that are not well understood. SYMPTOMS    Slight to heavy vaginal bleeding.  Vaginal swelling.  Mild to severe pain.  Vaginal tenderness. DIAGNOSIS  If the tear happened during childbirth, your caregiver can diagnose the tear at that time. To diagnose a vaginal tear that happened spontaneously or because of trauma, your caregiver will perform a physical exam. During the physical exam, your caregiver may also look for any signs of trouble that may need further testing. If there is hemorrhaging, your  caregiver may suggest blood tests to determine the extent of bleeding. Imaging tests may be performed, such as an ultrasonography or computed tomography (CT), to look for internal damage. A biopsy may be need if there are signs of a more serious problem.  TREATMENT  Treatment depends on the severity of the tear. For minor tears that heal on their own, treatment may only consist of keeping the area clean and dry. Some tears need to be repaired with stitches. Other tears may heal on their own with help from various remedies, such as antibiotic ointments, medicated creams, or petroleum products. Depending on the circumstances, oral hormones may also be suggested. Hormone remedies may also be in the form of topical creams and vaginal tablets. For more concerning situations, hospitalization and surgical repair of the tear may be needed. HOME CARE INSTRUCTIONS   Take warm-water baths that cover your hips and buttocks (sitz bath) 2 to 3 times a day. This may help any discomfort and swelling.   Only take over-the-counter or prescription medicines for pain, discomfort, or fever as directed by your caregiver. Do not use aspirin because it can cause increased bleeding.   Do not douche, use tampons, or have intercourse until your caregiver says it is okay.   A bandage (dressing) may have been applied. Change the dressing once a day or as directed. If the dressing sticks, soak it off with warm, soapy water.   Apply ice or witch hazel pads to the  vagina to lessen any pain or discomfort.   Take a stool softener or follow a special diet as directed by your caregiver. This will help ease discomfort associated with bowel movements.  SEEK IMMEDIATE MEDICAL CARE IF:   You have redness or swelling in the vaginal area.   You have increasing, sharp, or intense pain or tenderness in the vaginal area.  You have pus or unusual discharge coming from the tear or vagina.   You notice a bad smell coming from the vagina.   Your tear breaks open after it healed or was repaired.   You feel lightheaded.  You have increasing abdominal pain.   You have an increasing or heavy amount of vaginal bleeding.   You have pain with intercourse after the tear heals.  MAKE SURE YOU:  Understand these instructions.  Will watch your condition.  Will get help right away if you are not doing well or get worse.   This information is not intended to replace advice given to you by your health care provider. Make sure you discuss any questions you have with your health care provider.   Document Released: 02/06/2005 Document Revised: 11/01/2011 Document Reviewed: 06/26/2011 Elsevier Interactive Patient Education [employer]

## 2015-01-01 NOTE — Lactation Note (Addendum)
This note was copied from the chart of Kristen Joylene JohnMaria Kimmet. Lactation Consultation Note  Patient Name: Kristen Howard OZHYQ'MToday's Date: 01/01/2015 Reason for consult: Initial assessment (Infant spitting up formula, MD Order)   Initial consultation with 2634 hour old infant. Mom had originally chosen to bottle feed, since infant is spitting a lot she wants to BF. She nursed her 33 yo for 1 month. Mom is on Seroquel, which is an L2 in regards to Lactation. Infant was awake and rooting. He had just taken 10 cc formula via bottle. Mom is very modest and did not want to pull breast out. She did allow me assist her in latching infant. Mom with soft breasts, compressible areola and everted nipples. Assisted mom in latching infant to left breast in cross cradle hold. Infant latched easily with flanged lips and rhythmic sucking. A few audible swallows were heard. Infant nursed about 10 minutes then fell asleep. When fell asleep Infant started pinching mom, taught mom how to break suction prior to removing from the breast. Taught mom how to hand express breasts, small gtts colostrum noted. Discussed BF basics, benefits of BF, supply and demand, NL NB Feeding behaviors including cluster feeds, feeding 8-12 x in 24 hours at first feeding cues, always BF first before offering any supplement, stomach size, nutritional needs, transition of milk and coming to volume of milk, I/O, engorgement prevention/treatment and nipple care. Hand pump given to mom with instructions for use. Mom was drowsy with and after feeding. Infant with follow up with Dr. Donnie Coffinubin on Monday morning, [facility]. Reviewed BF Information in Taking Care of Baby and Me Booklet. [facility] Brochure given with information on OP Services, Support Groups, BF Resources and phone #. Enc mom to call with questions/concerns.    Maternal Data Has patient been taught Hand Expression?: Yes Does the patient have breastfeeding experience prior  to this delivery?: Yes  Feeding Feeding Type: Breast Fed Length of feed: 10 min  LATCH Score/Interventions Latch: Grasps breast easily, tongue down, lips flanged, rhythmical sucking.  Audible Swallowing: A few with stimulation Intervention(s): Skin to skin;Hand expression  Type of Nipple: Everted at rest and after stimulation  Comfort (Breast/Nipple): Soft / non-tender     Hold (Positioning): Assistance needed to correctly position infant at breast and maintain latch. Intervention(s): Breastfeeding basics reviewed;Support Pillows;Position options;Skin to skin  LATCH Score: 8  Lactation Tools Discussed/Used WIC Program: Yes Pump Review: Setup, frequency, and cleaning;Milk Storage Initiated by:: Noralee StainSharon Hice, RN, IBCLC Date initiated:: 01/01/15   Consult Status Consult Status: PRN    Kristen Howard 01/01/2015, 10:41 AM

## 2015-01-01 NOTE — Discharge Summary (Signed)
OB Discharge Summary    Patient Name: Kristen Howard DOB: [birth year] MRN: [id number]  Date of admission: 12/30/2014 Delivering MD: Cam Hai D   Date of discharge: 01/01/2015  Admitting diagnosis: 40w labor, discharge, pressure, ctx5 min Intrauterine pregnancy: [redacted]w[redacted]d     Secondary diagnosis: Active Problems:   Active labor     Discharge diagnosis: Term Pregnancy Delivered                                                                                                Post partum procedures: none  Augmentation: AROM  Complications: None  Hospital course:   Kristen Howard is a 34 y.o. G3P2011 with a PMH of well-controlled bipolar disorder who presented on 12/30/2014 at [redacted]w[redacted]d for spontaneous onset of labor. Patient had an uncomplicated labor course. She progressed to active labor without issue following augmentation with AROM. At  11:39 PM on 12/30/14 she delivered a healthy baby boy via normal spontaneous vaginal delivery. Pateint had an uncomplicated postpartum course and was discharged home in stable condition on 01/01/2015  Exam at time of discharge:  Subjective: Patient reports doing well this morning. Pain is controlled. Ambulating to bathroom without issues, reports normal voiding. Tolerating regular diet. Reports mild lochia with minimal bleeding, which is decreasing. Pertinent negatives on ROS include no blurry vision, no headache, no dyspnea, no chest pain, no epigastric or RUQ pain, no nausea or vomiting, no fevers, no hematuria, no dysuria.  Objective: Filed Vitals:   12/31/14 0820 12/31/14 1051 12/31/14 1800 01/01/15 0634  BP: 126/80 124/67 135/78 121/70  Pulse: 92 92 89 102  Temp: 97.7 F (36.5 C)  98 F (36.7 C) 97.5 F (36.4 C)  Resp: GEN: alert, comfortable-appearing woman resting in hospital bed, eating breakfast. PULM: CTAB on frontal field exam CV: RRR, S1 and S2 heard, no M/R/G appreciated ABD: Fundus firm below umbilicus. Abdomen  appropriately TTP. No epigastric or RUQ pain. No guarding. GU: Lochia appropriate EXTR: No LE edema or calf tenderness.   Discharge instruction: per After Visit Summary and "Baby and Me Booklet". -- Ibuprofen for pain management -- Continue prenatal vitamin -- Pelvic rest for 6 weeks -- f/u visits in 2 and 6 weeks with prenatal care provider  Medications:    Medication List    STOP taking these medications        desvenlafaxine 100 MG 24 hr tablet  Commonly known as:  PRISTIQ      TAKE these medications        acetaminophen 500 MG tablet  Commonly known as:  TYLENOL  Take 2 tablets (1,000 mg total) by mouth every 8 (eight) hours as needed for mild pain, moderate pain, fever or headache.     cloNIDine 0.1 MG tablet  Commonly known as:  CATAPRES  Take 0.2 mg by mouth 2 (two) times daily. Takes 2 tablets in am.and at night.     gabapentin 400 MG capsule  Commonly known as:  NEURONTIN  Take 400 mg by mouth 3 (three) times daily.     oxyCODONE-acetaminophen 5-325  MG tablet  Commonly known as:  PERCOCET/ROXICET  Take 1-2 tablets by mouth every 4 (four) hours as needed for severe pain (for pain scale 4-7).     pantoprazole 40 MG tablet  Commonly known as:  PROTONIX  Take 1 tablet (40 mg total) by mouth daily.     Prenatal Vitamins 0.8 MG tablet  Take 1 tablet by mouth daily.     QUEtiapine 200 MG 24 hr tablet  Commonly known as:  SEROQUEL XR  Take 600 mg by mouth at bedtime.     senna-docusate 8.6-50 MG tablet  Commonly known as:  Senokot-S  Take 2 tablets by mouth daily.     zolpidem 5 MG tablet  Commonly known as:  AMBIEN  Take 1 tablet (5 mg total) by mouth at bedtime as needed for sleep.       Diet: routine diet  Activity: Advance as tolerated. Pelvic rest for 6 weeks.   Outpatient follow up: 6 weeks  Postpartum contraception: IUD  Newborn Data: Live born female  Birth Weight: 7 lb 15 oz (3600 g) APGAR: 8, 9  Baby Feeding: Breast Disposition:home  with mother  Gerri SporeCaitlin Sullivan, MD 01/01/2015 9:24 AM  I have seen and examined this patient and agree the above assessment.  Respiratory effort normal, lochia appropriate, legs negative,  pain level normal.  CRESENZO-DISHMAN,Camren Henthorn 01/05/2015 11:23 AM

## 2015-01-05 ENCOUNTER — Inpatient Hospital Stay (HOSPITAL_COMMUNITY): Admission: RE | Admit: 2015-01-05 | Payer: Medicaid Other | Source: Ambulatory Visit

## 2015-02-18 ENCOUNTER — Ambulatory Visit: Payer: Medicaid Other | Admitting: Family Medicine

## 2015-03-11 ENCOUNTER — Ambulatory Visit: Payer: Medicaid Other | Admitting: Certified Nurse Midwife

## 2015-03-17 ENCOUNTER — Other Ambulatory Visit: Payer: Self-pay | Admitting: Internal Medicine

## 2016-09-16 IMAGING — US US OB COMP +14 WK
1 series · 12 of 28 positions shown · non-contrast
Comparison: none

[Series 1: us ob +14 all · 12 of 100 slices shown]
[im 4/100]
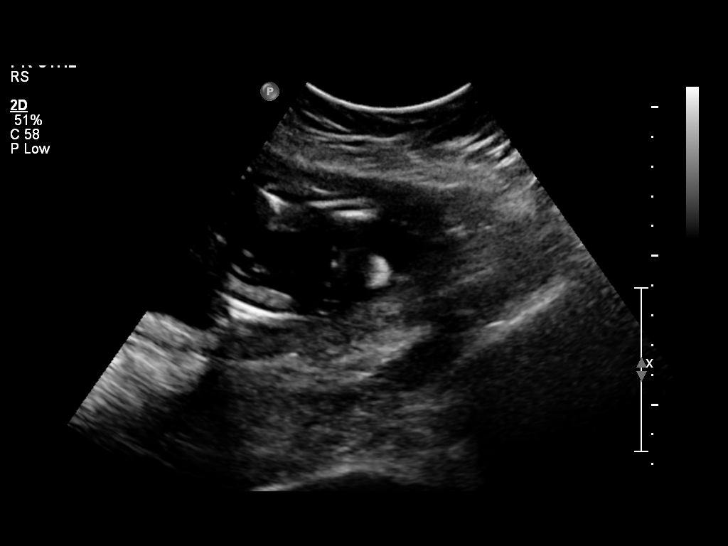
[im 12/100]
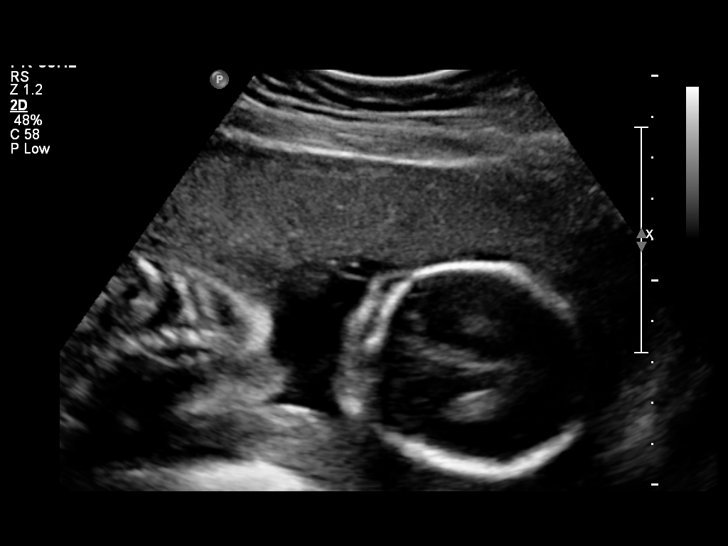
[im 19/100]
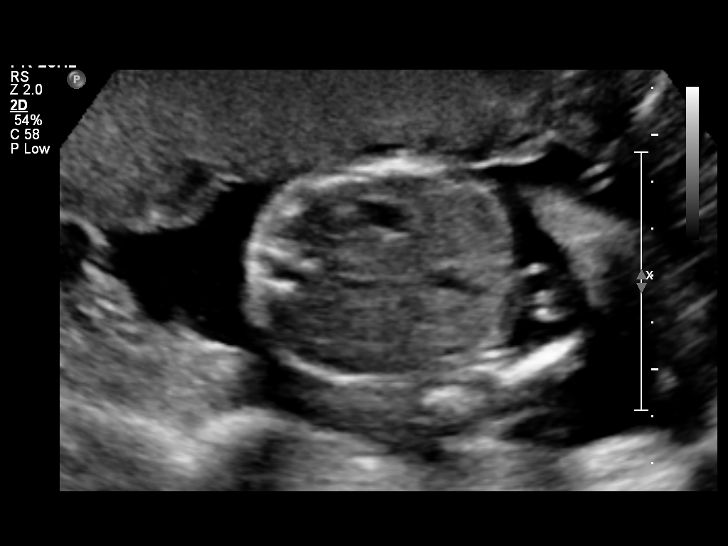
[im 30/100]
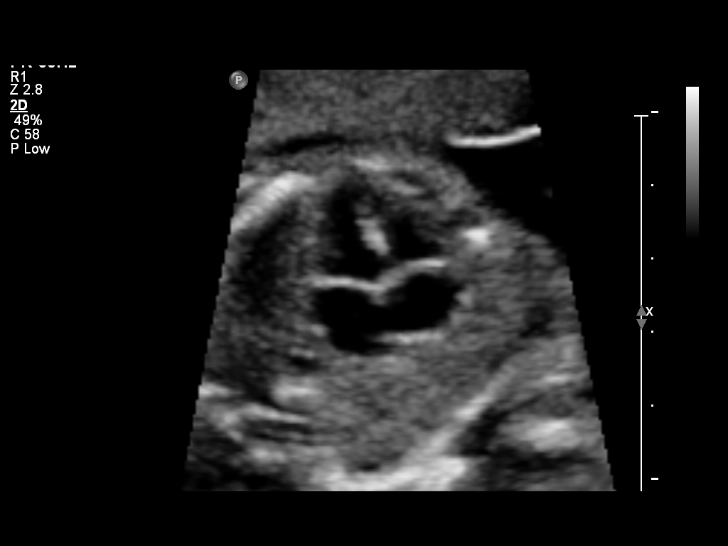
[im 37/100]
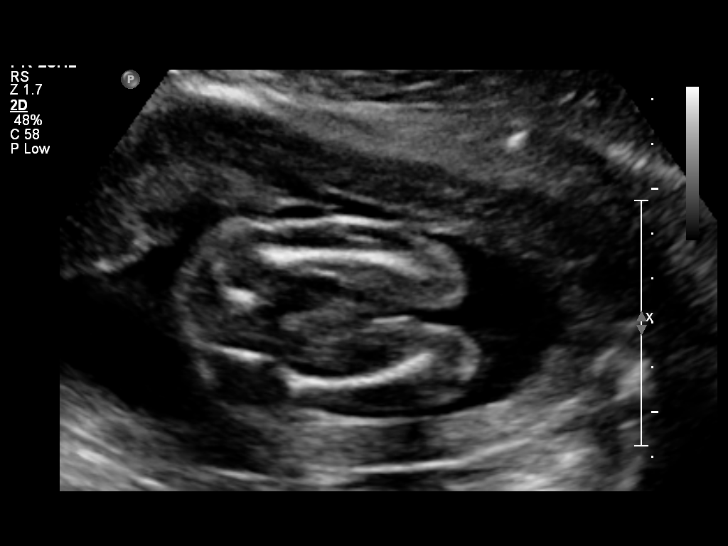
[im 45/100]
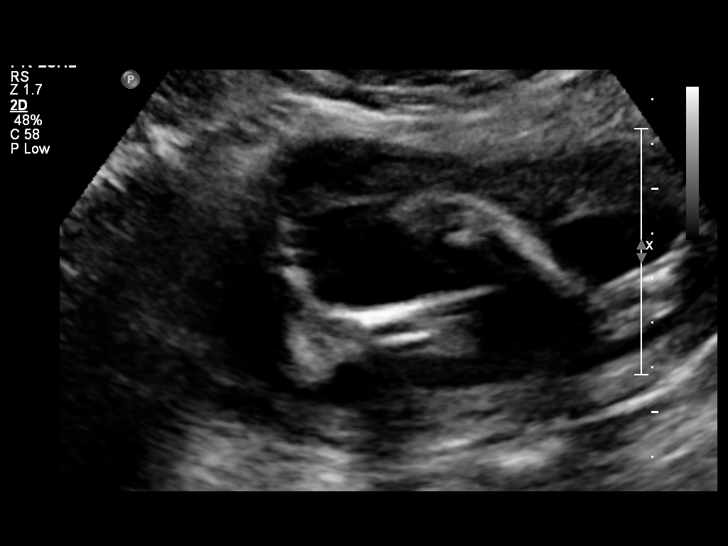
[im 56/100]
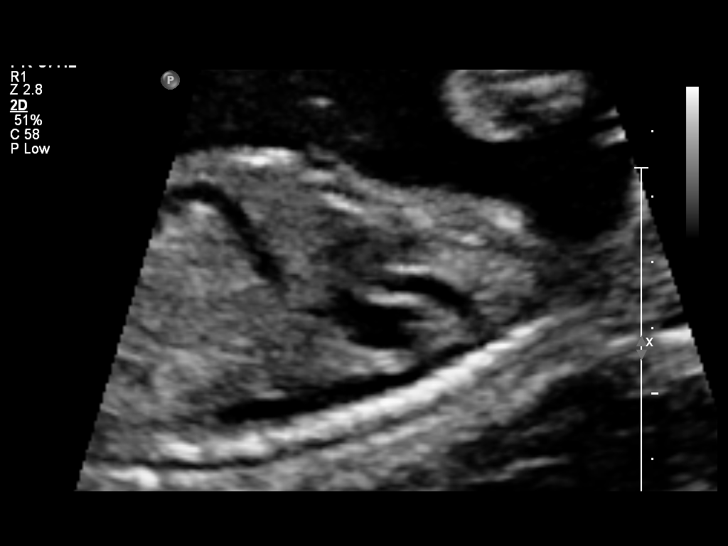
[im 63/100]
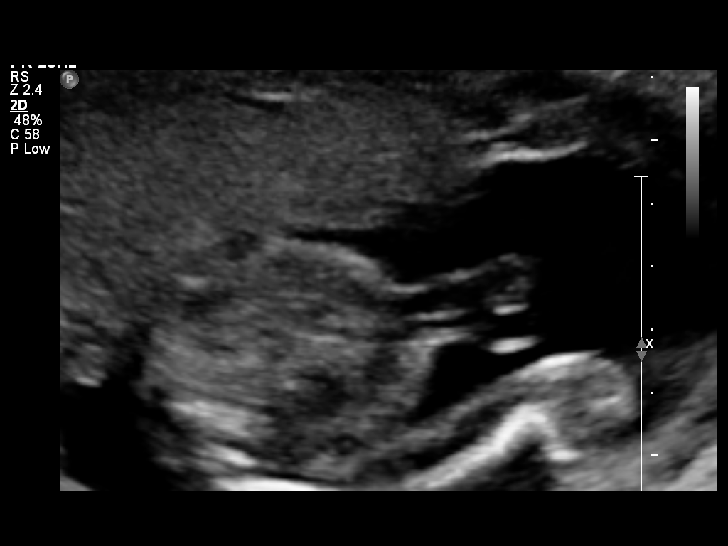
[im 70/100]
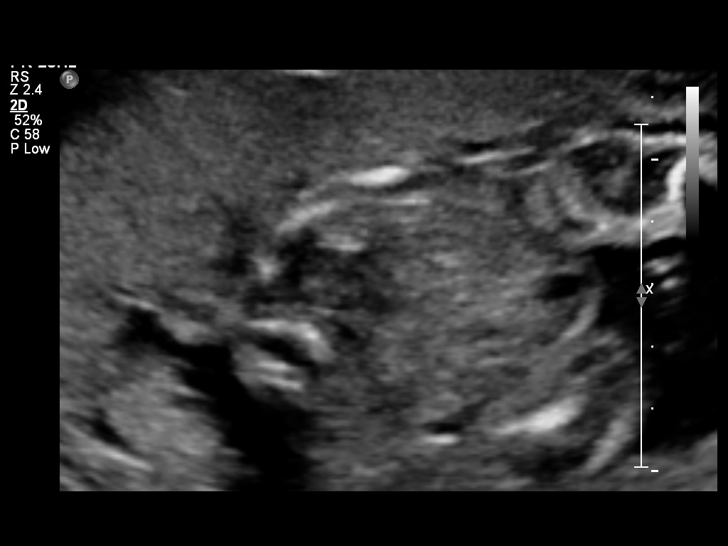
[im 81/100]
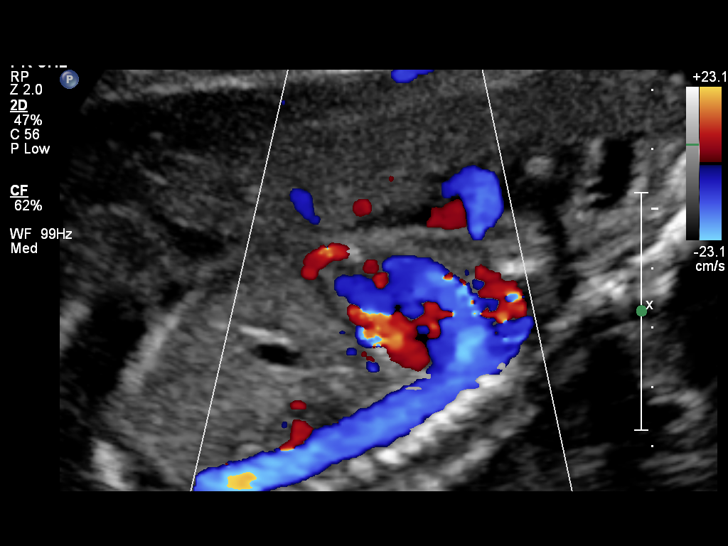
[im 89/100]
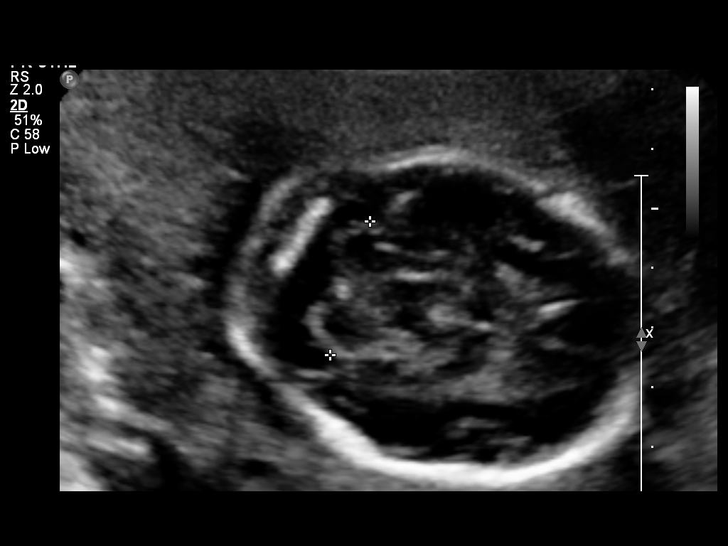
[im 96/100]
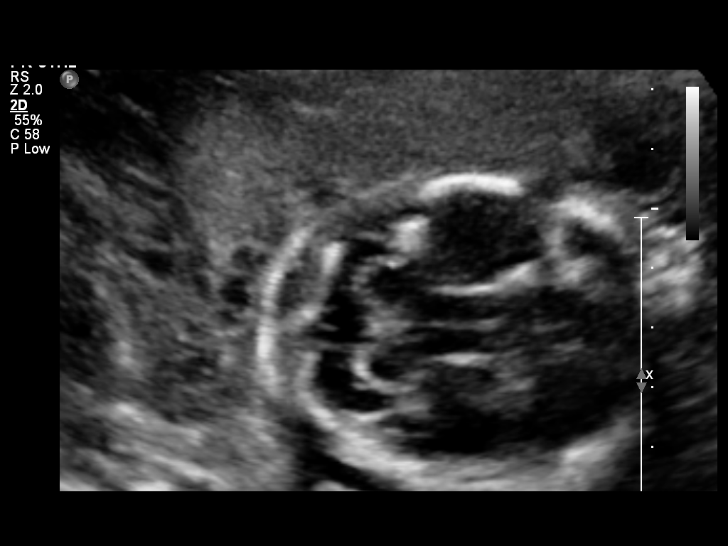

[12 of 28 positions shown; findings below may reference images not displayed]

OBSTETRICS REPORT
(Signed Final 08/25/2014 [DATE])

Name:       KAMIRIGADO SAH                 Visit Date: 08/25/2014 [DATE]

Service(s) Provided

US OB COMP + 14 WK                                    76805.1
Indications

Detailed fetal anatomic survey                        Z36
22 weeks gestation of pregnancy
Other mental disorder comlplicating pregnancy,
second trimester
Fetal Evaluation

Num Of Fetuses:    1
Fetal Heart Rate:  160                          bpm
Cardiac Activity:  Observed
Presentation:      Cephalic
Placenta:          Anterior, above cervical os
P. Cord            Visualized
Insertion:

Amniotic Fluid
AFI FV:      Subjectively within normal limits
Larg Pckt:     4.0  cm
Biometry

BPD:     53.4  mm     G. Age:  22w 2d                CI:        69.37   70 - 86
FL/HC:      19.1   18.4 -
20.2
HC:     204.7  mm     G. Age:  22w 4d       57  %    HC/AC:      1.12   1.06 -
1.25
AC:     183.3  mm     G. Age:  23w 1d       74  %    FL/BPD:     73.4   71 - 87
FL:      39.2  mm     G. Age:  22w 4d       55  %    FL/AC:      21.4   20 - 24
HUM:     35.1  mm     G. Age:  22w 0d       47  %

Est. FW:     540  gm      1 lb 3 oz     59  %
Gestational Age

LMP:           22w 1d        Date:  03/23/14                 EDD:   12/28/14
U/S Today:     22w 4d                                        EDD:   12/25/14
Best:          22w 1d     Det. By:  LMP  (03/23/14)          EDD:   12/28/14
Anatomy
Cranium:          Appears normal         Aortic Arch:      Appears normal
Fetal Cavum:      Appears normal         Ductal Arch:      Appears normal
Ventricles:       Appears normal         Diaphragm:        Appears normal
Choroid Plexus:   Appears normal         Stomach:          Appears normal, left
sided
Cerebellum:       Appears normal         Abdomen:          Appears normal
Posterior Fossa:  Appears normal         Abdominal Wall:   Appears nml (cord
insert, abd wall)
Nuchal Fold:      Not applicable (>20    Cord Vessels:     Appears normal (3
wks GA)                                  vessel cord)
Face:             Orbits appear          Kidneys:          Appear normal
normal
Lips:             Not well visualized    Bladder:          Appears normal
Heart:            Appears normal         Spine:            Not well visualized
(4CH, axis, and
situs)
RVOT:             Appears normal         Lower             Appears normal
Extremities:
LVOT:             Appears normal         Upper             Appears normal
Extremities:

Other:  Male gender. Heels and 5th digit visualized. Technically difficult due
to fetal position.
Cervix Uterus Adnexa

Cervical Length:    3        cm

Cervix:       Normal appearance by transabdominal scan.

Left Ovary:    Within normal limits.
Right Ovary:   Not visualized.
Adnexa:     No abnormality visualized.
Impression

Single IUP at 22w 1d
Normal fetal anatomic survey
Somewhat limited views of the fetal face (lips) obtained
The remainder of the fetal anatomy appears normal
Anterior placenta without previa
Normal amniotic fluid volume
Recommendations

Recommend follow-up ultrasound examination in 4-6 weeks
to complete anatomic survey

questions or concerns.

## 2020-01-08 ENCOUNTER — Telehealth: Payer: Self-pay | Admitting: Oncology

## 2020-01-08 NOTE — Telephone Encounter (Signed)
Kristen Howard 11/23 Labs, New Patient Appointment with Dr Melvyn Neth

## 2020-01-12 ENCOUNTER — Other Ambulatory Visit: Payer: Self-pay | Admitting: Oncology

## 2020-01-12 DIAGNOSIS — D508 Other iron deficiency anemias: Secondary | ICD-10-CM

## 2020-01-12 NOTE — Progress Notes (Unsigned)
[facility]  [address] [place],  [place]  [address] [phone]  Clinic Day:  01/12/2020  Referring physician: No ref. provider found   HISTORY OF PRESENT ILLNESS:  The patient is a 39 y.o. female  who I was asked to consult upon for anemia.  Recent labs showed a low hemoglobin of 9.5. he also had a low MCV of   .    PAST MEDICAL HISTORY:   Past Medical History:  Diagnosis Date  . Manic depression (HCC)   . PTSD (post-traumatic stress disorder)     PAST SURGICAL HISTORY:   Past Surgical History:  Procedure Laterality Date  . BREAST SURGERY  1998    Lump removed about age 70  . HAND SURGERY      CURRENT MEDICATIONS:   Current Outpatient Medications  Medication Sig Dispense Refill  . acetaminophen (TYLENOL) 500 MG tablet Take 2 tablets (1,000 mg total) by mouth every 8 (eight) hours as needed for mild pain, moderate pain, fever or headache. 90 tablet 0  . cloNIDine (CATAPRES) 0.1 MG tablet Take 0.2 mg by mouth 2 (two) times daily. Takes 2 tablets in am.and at night.    . gabapentin (NEURONTIN) 400 MG capsule Take 400 mg by mouth 3 (three) times daily.    Marland Kitchen ibuprofen (ADVIL,MOTRIN) 600 MG tablet Take 1 tablet (600 mg total) by mouth every 6 (six) hours. 30 tablet 0  . oxyCODONE-acetaminophen (PERCOCET/ROXICET) 5-325 MG tablet Take 1 tablet by mouth every 4 (four) hours as needed (for pain scale 4-7). 20 tablet 0  . pantoprazole (PROTONIX) 40 MG tablet Take 1 tablet (40 mg total) by mouth daily. 30 tablet 4  . Prenatal Multivit-Min-Fe-FA (PRENATAL VITAMINS) 0.8 MG tablet Take 1 tablet by mouth daily. 30 tablet 12  . QUEtiapine (SEROQUEL XR) 200 MG 24 hr tablet Take 600 mg by mouth at bedtime.    . senna-docusate (SENOKOT-S) 8.6-50 MG tablet Take 2 tablets by mouth daily. 30 tablet 0  . venlafaxine XR (EFFEXOR-XR) 150 MG 24 hr capsule Take 1 capsule (150 mg total) by mouth daily with breakfast. 30 capsule 1  . zolpidem (AMBIEN) 5 MG  tablet Take 1 tablet (5 mg total) by mouth at bedtime as needed for sleep. 20 tablet 0   No current facility-administered medications for this visit.    ALLERGIES:  No Known Allergies  FAMILY HISTORY:   Family History  Problem Relation Age of Onset  . Depression Mother   . Anxiety disorder Mother   . Alcoholism Mother   . Diabetes Mother   . Hypertension Mother   . Drug abuse Mother   . Lung cancer Father     SOCIAL HISTORY:   reports that she quit smoking about 5 years ago. She has never used smokeless tobacco. She reports that she does not drink alcohol and does not use drugs.  REVIEW OF SYSTEMS:  Review of Systems  Constitutional: Negative for fatigue and fever.  HENT:   Negative for hearing loss and sore throat.   Eyes: Negative for eye problems.  Respiratory: Negative for chest tightness, cough and hemoptysis.   Cardiovascular: Negative for chest pain and palpitations.  Gastrointestinal: Negative for abdominal distention, abdominal pain, blood in stool, constipation, diarrhea, nausea and vomiting.  Endocrine: Negative for hot flashes.  Genitourinary: Negative for difficulty urinating, dysuria, frequency, hematuria and nocturia.   Musculoskeletal: Negative for arthralgias, back pain, gait problem and myalgias.  Skin: Negative.  Negative for itching and rash.  Neurological: Negative.  Negative for dizziness, extremity weakness, gait problem, headaches, light-headedness and numbness.  Hematological: Negative.   Psychiatric/Behavioral: Negative.  Negative for depression and suicidal ideas. The patient is not nervous/anxious.      PHYSICAL EXAM:  unknown if currently breastfeeding. Wt Readings from Last 3 Encounters:  12/30/14 227 lb (103 kg)  12/30/14 227 lb (103 kg)  12/25/14 225 lb (102.1 kg)   There is no height or weight on file to calculate BMI. Performance status (ECOG): {CHL ONC [id number] Physical Exam Constitutional:      Appearance: Normal  appearance. She is not ill-appearing.  HENT:     Mouth/Throat:     Mouth: Mucous membranes are moist.     Pharynx: Oropharynx is clear. No oropharyngeal exudate or posterior oropharyngeal erythema.  Cardiovascular:     Rate and Rhythm: Normal rate and regular rhythm.     Heart sounds: No murmur heard.  No friction rub. No gallop.   Pulmonary:     Effort: Pulmonary effort is normal. No respiratory distress.     Breath sounds: Normal breath sounds. No wheezing, rhonchi or rales.  Abdominal:     General: Bowel sounds are normal. There is no distension.     Palpations: Abdomen is soft. There is no mass.     Tenderness: There is no abdominal tenderness.  Musculoskeletal:        General: No swelling.     Right lower leg: No edema.     Left lower leg: No edema.  Lymphadenopathy:     Cervical: No cervical adenopathy.     Upper Body:     Right upper body: No supraclavicular or axillary adenopathy.     Left upper body: No supraclavicular or axillary adenopathy.     Lower Body: No right inguinal adenopathy. No left inguinal adenopathy.  Skin:    General: Skin is warm.     Coloration: Skin is not jaundiced.     Findings: No lesion or rash.  Neurological:     General: No focal deficit present.     Mental Status: She is alert and oriented to person, place, and time. Mental status is at baseline.     Cranial Nerves: Cranial nerves are intact.  Psychiatric:        Mood and Affect: Mood normal.        Behavior: Behavior normal.        Thought Content: Thought content normal.    .phy  LABS:   CBC Latest Ref Rng & Units 12/30/2014 10/14/2014 09/16/2014  WBC 4.0 - 10.5 K/uL 14.6(H) 10.7(H) 11.4(H)  Hemoglobin 12.0 - 15.0 g/dL 11.1(L) 10.9(L) 10.9(L)  Hematocrit 36 - 46 % 33.2(L) 31.2(L) 32.7(L)  Platelets 150 - 400 K/uL 230 262 268   CMP Latest Ref Rng & Units 09/16/2014  Glucose 65 - 99 mg/dL 332(R)  BUN 7 - 25 mg/dL 8  Creatinine 5.18 - 8.41 mg/dL 6.60(Y)  Sodium 301 - 601 mEq/L 137    Potassium 3.5 - 5.3 mEq/L 4.0  Chloride 98 - 110 mEq/L 103  CO2 20 - 31 mEq/L 22  Calcium 8.6 - 10.2 mg/dL 8.7  Total Protein 6.1 - 8.1 g/dL 6.7  Total Bilirubin 0.2 - 1.2 mg/dL 0.2  Alkaline Phos 33 - 115 U/L 73  AST 10 - 30 U/L 16  ALT 6 - 29 U/L 19     No results found for: CEA1 / No results found for: CEA1 No results found for: PSA1 No results found  for: DGU440 No results found for: CAN125  No results found for: TOTALPROTELP, ALBUMINELP, A1GS, A2GS, BETS, BETA2SER, GAMS, MSPIKE, SPEI No results found for: TIBC, FERRITIN, IRONPCTSAT No results found for: LDH  No results found for: AFPTUMOR, TOTALPROTELP, ALBUMINELP, A1GS, A2GS, BETS, BETA2SER, GAMS, MSPIKE, SPEI, LDH, CEA1, PSA1, IGASERUM, IGGSERUM, IGMSERUM, THGAB, THYROGLB   STUDIES:  No results found.   ASSESSMENT & PLAN:  A 39 y.o. female who I was asked to consult upon for *** .The patient understands all the plans discussed today and is in agreement with them.  I do appreciate No ref. provider found for his new consult.   Braeleigh Pyper Kirby Funk, MD

## 2020-01-13 ENCOUNTER — Inpatient Hospital Stay: Payer: Medicaid Other | Attending: Oncology

## 2020-01-13 ENCOUNTER — Inpatient Hospital Stay: Payer: Medicaid Other | Admitting: Oncology
